# Patient Record
Sex: Female | Born: 1960 | Race: White | Hispanic: No | Smoking: Current every day smoker
Health system: Southern US, Community
[De-identification: ages and names within clinical notes are randomized; demographics above are authoritative.]

## PROBLEM LIST (undated history)

## (undated) DIAGNOSIS — N951 Menopausal and female climacteric states: Secondary | ICD-10-CM

## (undated) DIAGNOSIS — F32A Depression, unspecified: Secondary | ICD-10-CM

## (undated) DIAGNOSIS — G35 Multiple sclerosis: Secondary | ICD-10-CM

## (undated) DIAGNOSIS — F329 Major depressive disorder, single episode, unspecified: Secondary | ICD-10-CM

## (undated) DIAGNOSIS — F419 Anxiety disorder, unspecified: Secondary | ICD-10-CM

## (undated) HISTORY — DX: Major depressive disorder, single episode, unspecified: F32.9

## (undated) HISTORY — PX: BIOPSY BREAST: PRO8

## (undated) HISTORY — DX: Depression, unspecified: F32.A

## (undated) HISTORY — DX: Multiple sclerosis: G35

## (undated) HISTORY — DX: Anxiety disorder, unspecified: F41.9

## (undated) HISTORY — DX: Menopausal and female climacteric states: N95.1

---

## 1993-08-18 DIAGNOSIS — G35 Multiple sclerosis: Secondary | ICD-10-CM

## 1993-08-18 HISTORY — DX: Multiple sclerosis: G35

## 2004-05-06 ENCOUNTER — Emergency Department (HOSPITAL_COMMUNITY): Admission: EM | Admit: 2004-05-06 | Discharge: 2004-05-06 | Payer: Self-pay | Admitting: *Deleted

## 2008-01-07 ENCOUNTER — Ambulatory Visit: Payer: Self-pay | Admitting: Family Medicine

## 2008-01-07 DIAGNOSIS — G35 Multiple sclerosis: Secondary | ICD-10-CM | POA: Insufficient documentation

## 2008-01-14 ENCOUNTER — Encounter: Payer: Self-pay | Admitting: Family Medicine

## 2008-02-10 ENCOUNTER — Encounter: Payer: Self-pay | Admitting: Family Medicine

## 2008-03-02 ENCOUNTER — Ambulatory Visit: Payer: Self-pay | Admitting: Family Medicine

## 2008-03-02 ENCOUNTER — Encounter: Admission: RE | Admit: 2008-03-02 | Discharge: 2008-03-02 | Payer: Self-pay | Admitting: Family Medicine

## 2008-03-02 DIAGNOSIS — J441 Chronic obstructive pulmonary disease with (acute) exacerbation: Secondary | ICD-10-CM

## 2008-03-06 ENCOUNTER — Telehealth: Payer: Self-pay | Admitting: Family Medicine

## 2008-03-08 DIAGNOSIS — N319 Neuromuscular dysfunction of bladder, unspecified: Secondary | ICD-10-CM

## 2008-03-16 ENCOUNTER — Encounter: Payer: Self-pay | Admitting: Family Medicine

## 2008-03-16 ENCOUNTER — Telehealth (INDEPENDENT_AMBULATORY_CARE_PROVIDER_SITE_OTHER): Payer: Self-pay | Admitting: *Deleted

## 2008-03-16 ENCOUNTER — Ambulatory Visit: Payer: Self-pay | Admitting: Family Medicine

## 2008-03-16 ENCOUNTER — Other Ambulatory Visit: Admission: RE | Admit: 2008-03-16 | Discharge: 2008-03-16 | Payer: Self-pay | Admitting: Family Medicine

## 2008-03-16 DIAGNOSIS — N912 Amenorrhea, unspecified: Secondary | ICD-10-CM

## 2008-03-16 LAB — CONVERTED CEMR LAB: Beta hcg, urine, semiquantitative: NEGATIVE

## 2008-03-17 ENCOUNTER — Encounter: Admission: RE | Admit: 2008-03-17 | Discharge: 2008-03-17 | Payer: Self-pay | Admitting: Family Medicine

## 2008-03-22 ENCOUNTER — Encounter: Payer: Self-pay | Admitting: Family Medicine

## 2008-04-04 ENCOUNTER — Encounter: Payer: Self-pay | Admitting: Family Medicine

## 2008-05-03 ENCOUNTER — Telehealth: Payer: Self-pay | Admitting: Family Medicine

## 2008-05-04 ENCOUNTER — Encounter: Payer: Self-pay | Admitting: Family Medicine

## 2008-05-15 ENCOUNTER — Telehealth: Payer: Self-pay | Admitting: Family Medicine

## 2008-05-16 ENCOUNTER — Encounter: Admission: RE | Admit: 2008-05-16 | Discharge: 2008-05-16 | Payer: Self-pay | Admitting: Family Medicine

## 2008-05-16 ENCOUNTER — Ambulatory Visit: Payer: Self-pay | Admitting: Family Medicine

## 2008-05-17 ENCOUNTER — Telehealth: Payer: Self-pay | Admitting: Family Medicine

## 2008-06-15 ENCOUNTER — Ambulatory Visit: Payer: Self-pay | Admitting: Family Medicine

## 2008-07-04 ENCOUNTER — Telehealth: Payer: Self-pay | Admitting: Family Medicine

## 2008-07-07 ENCOUNTER — Telehealth: Payer: Self-pay | Admitting: Family Medicine

## 2008-07-17 ENCOUNTER — Ambulatory Visit: Payer: Self-pay | Admitting: Family Medicine

## 2008-07-17 ENCOUNTER — Telehealth: Payer: Self-pay | Admitting: Family Medicine

## 2008-07-17 LAB — CONVERTED CEMR LAB: Rapid Strep: NEGATIVE

## 2008-07-25 ENCOUNTER — Telehealth: Payer: Self-pay | Admitting: Family Medicine

## 2008-07-27 ENCOUNTER — Encounter: Payer: Self-pay | Admitting: Family Medicine

## 2008-08-03 ENCOUNTER — Telehealth: Payer: Self-pay | Admitting: Family Medicine

## 2008-08-25 ENCOUNTER — Encounter: Payer: Self-pay | Admitting: Family Medicine

## 2008-08-30 ENCOUNTER — Ambulatory Visit: Payer: Self-pay | Admitting: Family Medicine

## 2008-11-24 ENCOUNTER — Encounter: Payer: Self-pay | Admitting: Family Medicine

## 2008-12-13 ENCOUNTER — Ambulatory Visit: Payer: Self-pay | Admitting: Family Medicine

## 2008-12-14 ENCOUNTER — Encounter: Payer: Self-pay | Admitting: Family Medicine

## 2008-12-21 ENCOUNTER — Telehealth (INDEPENDENT_AMBULATORY_CARE_PROVIDER_SITE_OTHER): Payer: Self-pay | Admitting: *Deleted

## 2008-12-22 ENCOUNTER — Ambulatory Visit: Payer: Self-pay | Admitting: Family Medicine

## 2008-12-22 DIAGNOSIS — R0789 Other chest pain: Secondary | ICD-10-CM | POA: Insufficient documentation

## 2008-12-22 DIAGNOSIS — F339 Major depressive disorder, recurrent, unspecified: Secondary | ICD-10-CM | POA: Insufficient documentation

## 2009-01-02 ENCOUNTER — Ambulatory Visit: Payer: Self-pay | Admitting: Family Medicine

## 2009-01-02 DIAGNOSIS — N959 Unspecified menopausal and perimenopausal disorder: Secondary | ICD-10-CM | POA: Insufficient documentation

## 2009-01-09 ENCOUNTER — Telehealth: Payer: Self-pay | Admitting: Family Medicine

## 2009-02-15 ENCOUNTER — Telehealth: Payer: Self-pay | Admitting: Family Medicine

## 2009-02-20 ENCOUNTER — Ambulatory Visit: Payer: Self-pay | Admitting: Family Medicine

## 2009-02-20 DIAGNOSIS — R1011 Right upper quadrant pain: Secondary | ICD-10-CM | POA: Insufficient documentation

## 2009-03-08 ENCOUNTER — Telehealth: Payer: Self-pay | Admitting: Family Medicine

## 2009-03-26 ENCOUNTER — Telehealth: Payer: Self-pay | Admitting: Family Medicine

## 2009-05-24 ENCOUNTER — Telehealth: Payer: Self-pay | Admitting: Family Medicine

## 2009-06-21 ENCOUNTER — Ambulatory Visit: Payer: Self-pay | Admitting: Family Medicine

## 2009-06-21 DIAGNOSIS — K219 Gastro-esophageal reflux disease without esophagitis: Secondary | ICD-10-CM

## 2009-06-21 DIAGNOSIS — J069 Acute upper respiratory infection, unspecified: Secondary | ICD-10-CM | POA: Insufficient documentation

## 2009-07-05 ENCOUNTER — Encounter: Payer: Self-pay | Admitting: Family Medicine

## 2009-07-24 ENCOUNTER — Telehealth: Payer: Self-pay | Admitting: Family Medicine

## 2010-03-05 ENCOUNTER — Ambulatory Visit: Payer: Self-pay | Admitting: Family

## 2010-03-05 DIAGNOSIS — F411 Generalized anxiety disorder: Secondary | ICD-10-CM

## 2010-03-05 DIAGNOSIS — R209 Unspecified disturbances of skin sensation: Secondary | ICD-10-CM | POA: Insufficient documentation

## 2010-03-05 DIAGNOSIS — E559 Vitamin D deficiency, unspecified: Secondary | ICD-10-CM | POA: Insufficient documentation

## 2010-03-05 DIAGNOSIS — E538 Deficiency of other specified B group vitamins: Secondary | ICD-10-CM

## 2010-03-06 ENCOUNTER — Encounter: Payer: Self-pay | Admitting: Family

## 2010-03-06 LAB — CONVERTED CEMR LAB
BUN: 13 mg/dL (ref 6–23)
CO2: 24 meq/L (ref 19–32)
Chloride: 107 meq/L (ref 96–112)
Creatinine, Ser: 0.84 mg/dL (ref 0.40–1.20)

## 2010-03-08 ENCOUNTER — Telehealth: Payer: Self-pay | Admitting: Family Medicine

## 2010-04-05 ENCOUNTER — Ambulatory Visit: Payer: Self-pay | Admitting: Family Medicine

## 2010-04-05 DIAGNOSIS — J45901 Unspecified asthma with (acute) exacerbation: Secondary | ICD-10-CM | POA: Insufficient documentation

## 2010-04-29 ENCOUNTER — Encounter: Payer: Self-pay | Admitting: Family Medicine

## 2010-08-20 ENCOUNTER — Ambulatory Visit
Admission: RE | Admit: 2010-08-20 | Discharge: 2010-08-20 | Payer: Self-pay | Source: Home / Self Care | Attending: Family Medicine | Admitting: Family Medicine

## 2010-08-20 DIAGNOSIS — J019 Acute sinusitis, unspecified: Secondary | ICD-10-CM | POA: Insufficient documentation

## 2010-08-27 ENCOUNTER — Encounter: Payer: Self-pay | Admitting: Family Medicine

## 2010-09-06 ENCOUNTER — Telehealth: Payer: Self-pay | Admitting: Family Medicine

## 2010-09-09 ENCOUNTER — Encounter: Payer: Self-pay | Admitting: Family Medicine

## 2010-09-15 LAB — CONVERTED CEMR LAB: Rapid Strep: NEGATIVE

## 2010-09-18 NOTE — Assessment & Plan Note (Signed)
Summary: f/u on meds- jr   Vital Signs:  Patient profile:   50 year old female Height:      66.5 inches Weight:      165 pounds BMI:     26.33 Pulse rate:   87 / minute BP sitting:   130 / 71  (left arm) Cuff size:   regular  Vitals Entered By: Erika Hancock (March 05, 2010 1:22 PM) CC: needs xanax refilled   Primary Care Provider:  Nani Gasser MD  CC:  needs xanax refilled.  History of Present Illness: Erika Hancock is a 50 year old female who presents today for follow up.   Notes that she has history of Erika and has "Burning sensation" in both feet.  This has been present for several years.  Also notes that she has numbness in the toes of the left foot which started more recently.   She is followed by Dr. Loleta Chance of [facility] for fher Erika.  She stopped Tysabri due to water weight gain.  Reports that she has tried gabapentin in the past but she was intolerant to this medication.  Anxiety- notes that she becomes anxious surrounding the care of her 37 year old autistic child.   Notes that she cannot afford the co-pay on the Cymbalta.  She is taking prozac.  Feels that depression is ok.  Notes that she does have a short fuse.  Ran out of Xanax 3 weeks ago.  Was using xanax about twice a day, but notes that she requires two xanax at a time.    Current Medications (verified): 1)  Flonase 50 Mcg/act  Susp (Fluticasone Propionate) .... Use Two Sprays Each Nostril Daily 2)  Alprazolam 0.5 Mg  Tabs (Alprazolam) .... Take One Tyablet By Mouth Three Times A Day As Needed 3)  Prozac 40 Mg  Caps (Fluoxetine Hcl) .... Take One Tablet By Mouth Once A Day 4)  Hydrocodone-Acetaminophen 5-500 Mg  Tabs (Hydrocodone-Acetaminophen) .... Take One Tablet By Mouth Once A Day 5)  Albuterol Hfa .... 2 Puffs Q 4 Hrs Prn 6)  Albuterol Sulfate 1.25 Mg/48ml  Nebu (Albuterol Sulfate) .[name].. 1 Nebinhaled Q2-4 Hours As Needed Sob or Wheeze 7)  Symbicort 160-4.5 Mcg/act  Aero (Budesonide-Formoterol Fumarate) .... 2 Inh  Bid  Allergies (verified): No Known Drug Allergies  Comments:  Nurse/Medical Assistant: The patient's medications and allergies were reviewed with the patient and were updated in the Medication and Allergy Lists.  Physical Exam  General:  Well-developed,well-nourished,in no acute distress; alert,appropriate and cooperative throughout examination Lungs:  Normal respiratory effort, chest expands symmetrically. Lungs are clear to auscultation, no crackles or wheezes. Heart:  Normal rate and regular rhythm. S1 and S2 normal without gallop, murmur, click, rub or other extra sounds. Neurologic:  decreased sensation with monofilament noted on left foot.   Psych:  mildly anxious female, pleasant and A and O x 3  NAD   Impression & Recommendations:  Problem # 1:  NUMBNESS (ICD-782.0) Assessment Deteriorated May be due to history of Erika, will however check B12, folate, RPR and check serum glucose. Orders: T-Vitamin B12 [phone]) T-Folate [phone]) T-RPR (Syphilis) ([id number]) TLB-BMP (Basic Metabolic Panel-BMET) (80048-METABOL)  Problem # 2:  DEPRESSION, ACUTE (ICD-296.23) Assessment: Improved Overall stable,stopped cymbalta due to cost.  Will plan to continue Prozac  Problem # 3:  ANXIETY (ICD-300.00) Assessment: Deteriorated Will increase alprazolam as pt is already taking at higher dose.  Instructed patient to use as needed. The following medications were removed from the  medication list:    Cymbalta 60 Mg Cpep (Duloxetine hcl) .[name]... Take 1 tablet by mouth once a day Her updated medication list for this problem includes:    Alprazolam 1 Mg Tabs (Alprazolam) ..... One tablet by mouth three times a day prn    Prozac 40 Mg Caps (Fluoxetine hcl) .[name]... Take one tablet by mouth once a day  Complete Medication List: 1)  Flonase 50 Mcg/act Susp (Fluticasone propionate) .... Use two sprays each nostril daily 2)  Alprazolam 1 Mg Tabs (Alprazolam) .... One tablet by mouth three times a  day prn 3)  Prozac 40 Mg Caps (Fluoxetine hcl) .... Take one tablet by mouth once a day 4)  Hydrocodone-acetaminophen 5-500 Mg Tabs (Hydrocodone-acetaminophen) .... Take one tablet by mouth once a day 5)  Albuterol Hfa  .... 2 puffs q 4 hrs prn 6)  Albuterol Sulfate 1.25 Mg/52ml Nebu (Albuterol sulfate) .[name].. 1 nebinhaled q2-4 hours as needed sob or wheeze 7)  Symbicort 160-4.5 Mcg/act Aero (Budesonide-formoterol fumarate) .... 2 inh bid 8)  Cobal-1000 1000 Mcg/ml Soln (Cyanocobalamin) .... Once monthly injection 9)  Vitamin D (ergocalciferol) 50000 Unit Caps (Ergocalciferol) .... One cap by mouth weekly  Patient Instructions: 1)  Complete your blood work today. 2)  Follow up with Dr. Linford Arnold in 1-2 months. Prescriptions: ALPRAZOLAM 1 MG TABS (ALPRAZOLAM) one tablet by mouth three times a day PRN  #60 x 0   Entered and Authorized by:   Lemont Fillers FNP   Signed by:   Lemont Fillers FNP on 03/05/2010   Method used:   Print then Give to Patient   RxID:   [id number]

## 2010-09-18 NOTE — Letter (Signed)
   [place] [facility] [address] [place], [place]  [address]  [place] Phone: [phone]      March 06, 2010   [facility] [place] [address] [place], [place] [address]  RE:  LAB RESULTS  Dear  Ms. Partridge,  The following is an interpretation of your most recent lab tests.  Please take note of any instructions provided or changes to medications that have resulted from your lab work.  ELECTROLYTES:  Good - no changes needed  KIDNEY FUNCTION TESTS:  Good - no changes needed Your B12, folate and RPR levels are normal.  I suspect that the pain/burning that you are having is unfortunately due to your MS.  I would recommend that you discuss this further with your neurologist. Please contact us if you have any further questions.   Sincerely Yours,    Lemont Fillers FNP  Appended Document:  Letter mailed.

## 2010-09-18 NOTE — Letter (Signed)
Summary: Anxiety Questionnaire  Anxiety Questionnaire   Imported By: Lanelle Bal 04/12/2010 11:55:14  _____________________________________________________________________  External Attachment:    Type:   Image     Comment:   External Document

## 2010-09-18 NOTE — Assessment & Plan Note (Signed)
Summary: 1 mo fasting f/u anxiety, asthma exercerbation.    Vital Signs:  Patient profile:   50 year old female Height:      66.5 inches Weight:      161 pounds Pulse rate:   80 / minute BP sitting:   122 / 80  (left arm) Cuff size:   regular  Vitals Entered By: Avon Gully CMA, Duncan Dull) (April 05, 2010 8:40 AM)  Serial Vital Signs/Assessments:                                PEF    PreRx  PostRx Time      O2 Sat  O2 Type     L/min  L/min  L/min   By 9:07 AM                       200    160    170     Andrea McCrimmon CMA, (AAMA)  CC: f/u mood   Primary Care Julieann Drummonds:  Nani Gasser MD  CC:  f/u mood.  History of Present Illness: Cough and wheezing for one week.  Has used a whole ventolin inhaler. Worse when she lays flat. Chills all day yesterday.  + HA. NO ST or nasal congestion. Very tired. Hopes we have some sybicort samples. Has been out and copay is expensive.   Her anxiety is only fiarly well controlled. Does so much better on cymbalta but can't afford it. Says the higher does on the xanax has really helped her.   Current Medications (verified): 1)  Flonase 50 Mcg/act  Susp (Fluticasone Propionate) .... Use Two Sprays Each Nostril Daily 2)  Alprazolam 1 Mg Tabs (Alprazolam) .... One Tablet By Mouth Three Times A Day Prn 3)  Prozac 40 Mg  Caps (Fluoxetine Hcl) .... Take One Tablet By Mouth Once A Day 4)  Hydrocodone-Acetaminophen 5-500 Mg  Tabs (Hydrocodone-Acetaminophen) .... Take One Tablet By Mouth Once A Day 5)  Albuterol Hfa .... 2 Puffs Q 4 Hrs Prn 6)  Albuterol Sulfate 1.25 Mg/27ml  Nebu (Albuterol Sulfate) .[name].. 1 Nebinhaled Q2-4 Hours As Needed Sob or Wheeze 7)  Symbicort 160-4.5 Mcg/act  Aero (Budesonide-Formoterol Fumarate) .... 2 Inh Bid 8)  Cobal-1000 1000 Mcg/ml Soln (Cyanocobalamin) .... Once Monthly Injection 9)  Vitamin D (Ergocalciferol) 50000 Unit Caps (Ergocalciferol) .... One Cap By Mouth Weekly  Allergies (verified): No Known Drug  Allergies  Comments:  Nurse/Medical Assistant:  The patient's medications and allergies were reviewed with the patient and were updated in the Medication and Allergy Lists. Avon Gully CMA, Duncan Dull) (April 05, 2010 8:41 AM)  Social History: Retired Designer, industrial/product. Married recently to [employer]  Has grown son in [place] and a daughter with Asberger's syndrome. Ex husband is Programmer, applications.  Quit smoking 2010 infrequent alcohol use Walks 3 x a wk. Fair diet.  Physical Exam  General:  Well-developed,well-nourished,in no acute distress; alert,appropriate and cooperative throughout examination Head:  Normocephalic and atraumatic without obvious abnormalities. No apparent alopecia or balding. Eyes:  No corneal or conjunctival inflammation noted. EOMI. Perrla.  Ears:  External ear exam shows no significant lesions or deformities.  Otoscopic examination reveals clear canals, tympanic membranes are intact bilaterally without bulging, retraction, inflammation or discharge. Hearing is grossly normal bilaterally. Nose:  External nasal examination shows no deformity or inflammation. Nasal mucosa are pink and moist without lesions or exudates. Mouth:  Oral mucosa  and oropharynx without lesions or exudates.  Teeth in good repair. Neck:  No deformities, masses, or tenderness noted. Lungs:  Expiratory wheezing bilat with crackles at the left base. normal respiratory effort.  Is more SOB with talking.  Heart:  Normal rate and regular rhythm. S1 and S2 normal without gallop, murmur, click, rub or other extra sounds. Skin:  no rashes.   Psych:  Cognition and judgment appear intact. Alert and cooperative with normal attention span and concentration. No apparent delusions, illusions, hallucinations   Impression & Recommendations:  Problem # 1:  ANXIETY (ICD-300.00) She is not well controlled at all. Her GAD-7 score is 16.  She is not on the higher strength alprazolam and it is working much better. She does  much better on cymbalta but cannot afford the copay at this time.  Her updated medication list for this problem includes:    Alprazolam 1 Mg Tabs (Alprazolam) ..... One tablet by mouth three times a day prn    Prozac 40 Mg Caps (Fluoxetine hcl) .[name]... Take one tablet by mouth once a day  Problem # 2:  ASTHMA UNSPECIFIED WITH EXACERBATION (ICD-493.92) I think she likley has PNA> She didn't want a neb tx in the office since has already had 3 this AM and she Also didn't want oral steroids but did agree to let me give her steroid injection in the office.  Will treat for likely PNA. REcommend NEBs every 4 hours.  Given copay card and sample os symbicort. I prefer she f/u next week but she said if she was better she won't come back in. Tried to remind her that you can die from asthma and to really seek care if gets worse.  Her updated medication list for this problem includes:    Albuterol Sulfate 1.25 Mg/35ml Nebu (Albuterol sulfate) .[name]... 1 nebinhaled q2-4 hours as needed sob or wheeze    Symbicort 160-4.5 Mcg/act Aero (Budesonide-formoterol fumarate) .[name]... 2 inh bid  Orders: Depo- Medrol 80mg  (J1040) Peak Flow Rate (94150)  Complete Medication List: 1)  Flonase 50 Mcg/act Susp (Fluticasone propionate) .... Use two sprays each nostril daily 2)  Alprazolam 1 Mg Tabs (Alprazolam) .... One tablet by mouth three times a day prn 3)  Prozac 40 Mg Caps (Fluoxetine hcl) .... Take one tablet by mouth once a day 4)  Hydrocodone-acetaminophen 5-500 Mg Tabs (Hydrocodone-acetaminophen) .... Take one tablet by mouth once a day 5)  Albuterol Hfa  .... 2 puffs q 4 hrs prn 6)  Albuterol Sulfate 1.25 Mg/54ml Nebu (Albuterol sulfate) .[name].. 1 nebinhaled q2-4 hours as needed sob or wheeze 7)  Symbicort 160-4.5 Mcg/act Aero (Budesonide-formoterol fumarate) .... 2 inh bid 8)  Cobal-1000 1000 Mcg/ml Soln (Cyanocobalamin) .... Once monthly injection 9)  Vitamin D (ergocalciferol) 50000 Unit Caps (Ergocalciferol) .... One cap  by mouth weekly 10)  Zithromax Z-pak 250 Mg Tabs (Azithromycin) .... Take as directed.  Patient Instructions: 1)  Take antiobiotic. If not getting better into next week call the office. 2)  Go to ED if your asthma gets worse. 3)  DO you Nebs every 4 hours for the next 3-4 days and then can space out to every 6 hours as feeling better.  Prescriptions: ALPRAZOLAM 1 MG TABS (ALPRAZOLAM) one tablet by mouth three times a day PRN  #60 x 0   Entered and Authorized by:   Nani Gasser MD   Signed by:   Nani Gasser MD on 04/05/2010   Method used:   Printed then faxed to .Marland KitchenMarland Kitchen  [facility]  [address] (retail)       [address]       [place], [place]  [address]       Ph: [phone]       Fax: [phone]   RxID:   [id number] ALBUTEROL SULFATE 1.25 MG/3ML  NEBU (ALBUTEROL SULFATE) 1 NEBinhaled Q2-4 hours as needed SOB or wheeze  #1 box x 3   Entered and Authorized by:   Nani Gasser MD   Signed by:   Nani Gasser MD on 04/05/2010   Method used:   Electronically to        [facility]  [address] (retail)       [address]       [place], [place]  [address]       Ph: [phone]       Fax: [phone]   RxID:   [id number] ZITHROMAX Z-PAK 250 MG TABS (AZITHROMYCIN) Take as directed.  #1 x 0   Entered and Authorized by:   Nani Gasser MD   Signed by:   Nani Gasser MD on 04/05/2010   Method used:   Electronically to        [facility] (retail)       [address]       [place], [place]  [address]       Ph: [phone]       Fax: [phone]   RxID:   [id number]    Medication Administration  Injection # 1:    Medication: Depo- Medrol 80mg     Diagnosis: ASTHMA UNSPECIFIED WITH EXACERBATION [phone])    Route: IM    Site: RUOQ gluteus    Exp Date: 09/18/2010    Lot #: [id number] [id number]    Mfr: Pharmacia    Patient tolerated injection without complications    Given by: Avon Gully CMA, Duncan Dull) (April 05, 2010 9:09  AM)  Orders Added: 1)  Depo- Medrol 80mg  [J1040] 2)  Est. Patient Level IV [37106] 3)  Peak Flow Rate [94[address]]

## 2010-09-18 NOTE — Letter (Signed)
Summary: [facility]  [facility]   Imported By: Lanelle Bal 05/31/2010 12:31:22  _____________________________________________________________________  External Attachment:    Type:   Image     Comment:   External Document

## 2010-09-18 NOTE — Progress Notes (Signed)
Summary: lab results  Phone Note Call from Patient Call back at [phone]   Caller: Patient Call For: Nani Gasser MD Summary of Call: pt called wanting to know lab results- gave her instructions on labs per the letter that Sandford Craze had sent her Initial call taken by: Kathlene November,  March 08, 2010 12:43 PM

## 2010-09-19 NOTE — Progress Notes (Addendum)
Summary: Med Issue  Phone Note Call from Patient Call back at Home Phone [phone]   Caller: Patient Reason for Call: Refill Medication Summary of Call: Refill was sent on 09/03/10 for generic prozac.  Pt was previously taking 40 mg/day and new rx was for prozac 20 mg--1/2 tab for one week then one tablet daily.  Pt is confused by new rx.  Please call pt to clarify. Initial call taken by: Francee Piccolo CMA Duncan Dull),  September 06, 2010 3:47 PM  Follow-up for Phone Call        Per our records the 40mg  was last filled in July so I assumed she wanted to re-start the med so to do that I had to start at the lower dose. Then when do for refill can call her and have [place] bump it ot the 40mg .  If has been off the meds for awhile can't restart at the 40mg  dose, unless she was getting it somewhere else for awhile.  Follow-up by: Nani Gasser MD,  September 09, 2010 9:06 AM  Additional Follow-up for Phone Call Additional follow up Details #1::        pt notified Additional Follow-up by: Avon Gully CMA, Duncan Dull),  September 10, 2010 11:09 AM
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
## 2010-10-01 ENCOUNTER — Telehealth: Payer: Self-pay | Admitting: Family Medicine

## 2010-10-03 NOTE — Letter (Signed)
Summary: [facility]  [facility]   Imported By: Maryln Gottron 09/25/2010 10:32:39  _____________________________________________________________________  External Attachment:    Type:   Image     Comment:   External Document

## 2010-10-09 NOTE — Progress Notes (Signed)
Summary: [facility]-med issue  Phone Note Call from Patient Call back at Home Phone [phone]   Caller: Patient Call For: Nani Gasser MD Summary of Call: pt is trying to transfer rx for generic prozac.  Unsure in message left which pharm she is transferring to/from.  LM to RC at home number  Initial call taken by: Francee Piccolo CMA Duncan Dull),  October 01, 2010 3:01 PM  Follow-up for Phone Call        Received call from [facility] at [facility].  Pt needs rx for 40mg  prozac called to them.  According to [facility], pt is doubling up 20 mg from an old rx.  Still have not received a return call from pt to clarify any of above. Follow-up by: Francee Piccolo CMA Duncan Dull),  October 01, 2010 3:52 PM  Additional Follow-up for Phone Call Additional follow up Details #1::        Pt really needs to f/u in the ofice.   Additional Follow-up by: Nani Gasser MD,  October 01, 2010 4:41 PM    Additional Follow-up for Phone Call Additional follow up Details #2::    RC #2 to pt.  LM to RC at home number. Francee Piccolo CMA Duncan Dull)  October 02, 2010 1:43 PM  RC from pt.  Pt states she has never been off of prozac in the last 8 1/2 years and does not understand why we call in the 20 mg tablets last time.  I tried to explain to pt that given the date of her last refill and the number of tablets given she should have run out of meds in November, not January.  Pt states she has never missed a dose.  Given this information, pt would like to know if she still needs an office visit or can she just have refills.  Pt is aware Dr. Linford Arnold out of the office this afternoon and will not be in until tomorrow.  Pt states she has enough meds to last a few more days.  Last anxiety follow up was in 03/2010.  Please advise. Follow-up by: Francee Piccolo CMA Duncan Dull),  October 02, 2010 2:00 PM  Additional Follow-up for Phone Call Additional follow up Details #3:: Details for Additional Follow-up Action Taken: OK  will change to 40mg . I assumed she was out since the rx I last gave her would have run out in Nov thus I was calling in a "restart dose".  New rx sent. See below.  Additional Follow-up by: Nani Gasser MD,  October 03, 2010 9:54 AM  New/Updated Medications: FLUOXETINE HCL 40 MG CAPS (FLUOXETINE HCL) Take 1 tablet by mouth once a day Prescriptions: FLUOXETINE HCL 40 MG CAPS (FLUOXETINE HCL) Take 1 tablet by mouth once a day  #30 x 4   Entered by:   Francee Piccolo CMA (AAMA)   Authorized by:   Nani Gasser MD   Signed by:   Francee Piccolo CMA (AAMA) on 10/03/2010   Method used:   Electronically to        [facility]* (retail)       [address]       [place], [place]  [address]       Ph: [phone]       Fax: [phone]   RxID:   [id number] FLUOXETINE HCL 40 MG CAPS (FLUOXETINE HCL) Take 1 tablet by mouth once a day  #30 x 4   Entered and Authorized by:   Nani Gasser MD   Signed by:  Nani Gasser MD on 10/03/2010   Method used:   Electronically to        [facility]* (retail)       [address]       [place], [place]  [address]       Ph: [phone]       Fax: [phone]   RxID:   [id number]  RX sent to [facility] per patient request.  Rx cancelled at [facility].  Notified pt rx sent to pharm.  She is appreciative. Francee Piccolo CMA Duncan Dull)  October 03, 2010 3:50 PM

## 2010-11-14 ENCOUNTER — Other Ambulatory Visit: Payer: Self-pay | Admitting: *Deleted

## 2010-11-14 MED ORDER — ALBUTEROL SULFATE HFA 108 (90 BASE) MCG/ACT IN AERS: 2.0000 | INHALATION_SPRAY | Freq: Four times a day (QID) | RESPIRATORY_TRACT | Status: DC | PRN

## 2011-05-06 ENCOUNTER — Other Ambulatory Visit: Payer: Self-pay | Admitting: Family Medicine

## 2011-05-06 ENCOUNTER — Telehealth: Payer: Self-pay | Admitting: Family Medicine

## 2011-05-06 MED ORDER — BUDESONIDE-FORMOTEROL FUMARATE 160-4.5 MCG/ACT IN AERO: 2.0000 | INHALATION_SPRAY | Freq: Two times a day (BID) | RESPIRATORY_TRACT | Status: DC

## 2011-05-06 NOTE — Telephone Encounter (Signed)
Refill requested for symbicort. Plan:  Already sent today electronically. Jarvis Newcomer, LPN [facility]

## 2011-05-06 NOTE — Telephone Encounter (Signed)
Patient called left voicemail that her [place] pharmacy has faxed over request for refill twice and they have not heard anything from our office. Pt states if there are any issues you can call her [phone]

## 2011-05-19 ENCOUNTER — Encounter: Payer: Self-pay | Admitting: Family Medicine

## 2011-05-20 ENCOUNTER — Ambulatory Visit (INDEPENDENT_AMBULATORY_CARE_PROVIDER_SITE_OTHER): Payer: Medicare PPO | Admitting: Family Medicine

## 2011-05-20 ENCOUNTER — Encounter: Payer: Self-pay | Admitting: Family Medicine

## 2011-05-20 VITALS — BP 128/84 | HR 107 | Wt 179.0 lb

## 2011-05-20 DIAGNOSIS — R1011 Right upper quadrant pain: Secondary | ICD-10-CM

## 2011-05-20 NOTE — Progress Notes (Signed)
Subjective:    Patient ID: Erika Hancock, female    DOB: 1961-02-13, 50 y.o.   MRN: [id number]  HPI Pain on RT upper side, intermittant for several months.  Getting worse over the last week. Went to ED at [facility] and they didn't do a GB US.  Says phenergan didn't really help.  Did blood work which was all normal. Normal CXR.  Has been SOB. Pain feels like a burning pain. Also some aching into her neck. Worse after she ets.  They put her on lasix to help with swelling.  Vomiting occ.  More loose stools lately.    Review of Systems  BP 128/84  Pulse 107  Wt 179 lb (81.194 kg)    No Known Allergies  Past Medical History  Diagnosis Date  . MS (multiple sclerosis) 1995    relapsing  . Anxiety   . Depression   . Asthma   . Perimenopausal     Past Surgical History  Procedure Date  . Biopsy breast 1999, 2000    benign    History   Social History  . Marital Status: Legally Separated    Spouse Name: N/A    Number of Children: N/A  . Years of Education: N/A   Occupational History  . Not on file.   Social History Main Topics  . Smoking status: Former Smoker    Quit date: 08/18/2008  . Smokeless tobacco: Not on file  . Alcohol Use: Yes     infrequent  . Drug Use:   . Sexually Active:      lab tech, married, 2 children, walks 3 X week, fair diet.   Other Topics Concern  . Not on file   Social History Narrative  . No narrative on file    Family History  Problem Relation Age of Onset  . Heart disease Mother     died CHF  . Heart disease Father 4    heart attack  . Hypertension Father   . COPD Sister   . Glaucoma Sister   . Hypertension Sister   . Multiple sclerosis      cousin    Erika Hancock does not currently have medications on file.     Objective:   Physical Exam  Constitutional: She is oriented to person, place, and time. She appears well-developed and well-nourished.  HENT:  Head: Normocephalic and atraumatic.  Cardiovascular: Normal rate,  regular rhythm and normal heart sounds.   Pulmonary/Chest: Effort normal and breath sounds normal.  Abdominal: Soft. Bowel sounds are normal. She exhibits no distension and no mass. There is tenderness. There is no rebound and no guarding.       Tender in both Upper quad but more so in the RUQ.    Neurological: She is alert and oriented to person, place, and time.  Skin: Skin is warm and dry.  Psychiatric: She has a normal mood and affect. Her behavior is normal.          Assessment & Plan:  RUQ Pain - likely she has cholecystitis. Evidently she had normal blood work yesterday but I would like her to get a blood count again today to make sure her white count started increasing so she does so she had a low-grade temp yesterday. We will schedule her for an ultrasound here later this afternoon or first thing in the morning. She has to be fasting for approximately 6 hours and she did eat some soup a couple of hours ago. I  encouraged her to eat a very bland diet. Avoid anything with it enough and content which was similar to the gallbladder. She already takes a PPI and I encouraged her to continue this and she can increase to twice a day if needed. I offered her prescription for Phenergan but she already has it at home and says it doesn't really help. She does have a positive ultrasound scan of her gallbladder then we will need to refer her probably for surgical removal.

## 2011-05-21 ENCOUNTER — Other Ambulatory Visit: Payer: Self-pay | Admitting: Family Medicine

## 2011-05-21 ENCOUNTER — Ambulatory Visit
Admission: RE | Admit: 2011-05-21 | Discharge: 2011-05-21 | Disposition: A | Discharge status: Home / Self Care | Payer: Medicare PPO | Source: Ambulatory Visit | Attending: Family Medicine | Admitting: Family Medicine

## 2011-05-21 ENCOUNTER — Telehealth: Payer: Self-pay | Admitting: *Deleted

## 2011-05-21 DIAGNOSIS — R1011 Right upper quadrant pain: Secondary | ICD-10-CM

## 2011-05-21 LAB — COMPLETE METABOLIC PANEL WITH GFR
ALT: 12 U/L (ref 0–35)
AST: 18 U/L (ref 0–37)
CO2: 27 mEq/L (ref 19–32)
Calcium: 9.5 mg/dL (ref 8.4–10.5)
Chloride: 99 mEq/L (ref 96–112)
Creat: 0.87 mg/dL (ref 0.50–1.10)
GFR, Est African American: >60 mL/min (ref >60)
Potassium: 4.3 mEq/L (ref 3.5–5.3)
Sodium: 139 mEq/L (ref 135–145)
Total Protein: 7.4 g/dL (ref 6.0–8.3)

## 2011-05-21 LAB — CBC WITH DIFFERENTIAL/PLATELET
Basophils Absolute: 0.1 10*3/uL (ref 0.0–0.1)
Eosinophils Relative: 4 % (ref 0–5)
Lymphocytes Relative: 29 % (ref 12–46)
Lymphs Abs: 2.4 10*3/uL (ref 0.7–4.0)
MCV: 100.2 fL — ABNORMAL HIGH (ref 78.0–100.0)
Neutro Abs: 5.1 10*3/uL (ref 1.7–7.7)
Neutrophils Relative %: 60 % (ref 43–77)
Platelets: 249 10*3/uL (ref 150–400)
RBC: 4.11 MIL/uL (ref 3.87–5.11)
RDW: 13.7 % (ref 11.5–15.5)
WBC: 8.5 10*3/uL (ref 4.0–10.5)

## 2011-05-21 NOTE — Telephone Encounter (Signed)
Pt notified of results via VM. KJ LPN 

## 2011-05-21 NOTE — Telephone Encounter (Signed)
Message copied by Lanae Crumbly on Wed May 21, 2011  9:48 AM ------      Message from: Nani Gasser D      Created: Wed May 21, 2011  7:48 AM       White count is normal which is reassuring for no gallbladder infection. That she may still have stones and inflammation. Her liver enzymes were normal as well.

## 2011-05-22 ENCOUNTER — Telehealth: Payer: Self-pay | Admitting: Family Medicine

## 2011-05-22 DIAGNOSIS — R1011 Right upper quadrant pain: Secondary | ICD-10-CM

## 2011-05-22 NOTE — Telephone Encounter (Signed)
Pt informed. .mysiog

## 2011-05-22 NOTE — Telephone Encounter (Signed)
Message copied by Gifford Shave on Thu May 22, 2011  8:53 AM ------      Message from: Nani Gasser D      Created: Wed May 21, 2011  9:15 PM       Gallbladder appears normal.  Some sludge in the common bile duct. Let me knwo how she is doing.

## 2011-05-22 NOTE — Telephone Encounter (Signed)
Pt calling for results of her ultrasound. Plan:  Pt given results and still complaining of pain #8/10 and tenderness at sight, and nauseted. Please advise. Routed to Dr. Marlyne Beards, LPN [facility]'

## 2011-05-22 NOTE — Telephone Encounter (Signed)
I will refer her to surgery to see if they feel she might benefit from gallbladder removal. I also want her to start prilosec OTC  Bid to help with her symptoms.

## 2011-05-23 ENCOUNTER — Telehealth: Payer: Self-pay | Admitting: *Deleted

## 2011-05-23 NOTE — Telephone Encounter (Signed)
Pt notified and is still having the pain. Advised we will be referring to surgeon

## 2011-05-23 NOTE — Telephone Encounter (Signed)
Message copied by Wyline Beady on Fri May 23, 2011  8:54 AM ------      Message from: Nani Gasser D      Created: Wed May 21, 2011  9:15 PM       Gallbladder appears normal.  Some sludge in the common bile duct. Let me knwo how she is doing.

## 2011-06-03 ENCOUNTER — Ambulatory Visit (INDEPENDENT_AMBULATORY_CARE_PROVIDER_SITE_OTHER): Payer: Medicare PPO | Admitting: Surgery

## 2011-06-03 ENCOUNTER — Encounter (INDEPENDENT_AMBULATORY_CARE_PROVIDER_SITE_OTHER): Payer: Self-pay | Admitting: Surgery

## 2011-06-03 VITALS — BP 123/76 | HR 81 | Ht 68.0 in | Wt 186.0 lb

## 2011-06-03 DIAGNOSIS — K8020 Calculus of gallbladder without cholecystitis without obstruction: Secondary | ICD-10-CM

## 2011-06-03 DIAGNOSIS — K8050 Calculus of bile duct without cholangitis or cholecystitis without obstruction: Secondary | ICD-10-CM | POA: Insufficient documentation

## 2011-06-03 NOTE — Patient Instructions (Signed)
Laparoscopic Removal of Gallbladder (Cholecystectomy), Care After Refer to this sheet in the next few weeks. These discharge instructions provide you with general information on caring for yourself after you leave the hospital. Your caregiver may also give you specific instructions. Your treatment has been planned according to the most current medical practices available, but unavoidable complications sometimes occur. If you have any problems or questions after discharge, please call your caregiver. HOME CARE INSTRUCTIONS  You may put ice on the surgical site.   Put ice in a plastic bag.   Place a towel between your skin and the bag.   Leave the ice on for 20 minutes, 3 times per day.   Change bandages (dressings) as directed.   Keep the wound dry and clean. The wound may be washed gently with a soap that kills germs (antimicrobial). Gently blot or dab dry. Do not rub the wound.   Do not take baths or use swimming pools or hot tubs for 10 days, or as instructed by your caregiver.   Only take over-the-counter or prescription medicines for pain, discomfort, or fever as directed by your caregiver.   You may continue your normal diet as directed.   Do not lift anything more than 10 pounds or play contact sports for 3 weeks, or as directed.   SEEK MEDICAL CARE IF:  There is redness, swelling, or increasing pain in the wound.   You notice pus coming from the wound.   There is drainage from a wound lasting longer than 1 day.   You have an oral temperature above 101.   There is a bad smell coming from the wound or dressing.   The cut (incision) breaks open.  SEEK IMMEDIATE MEDICAL CARE IF:  You develop a rash.   You have difficulty breathing.   You develop any reaction or side effects to medicines given.   You have an oral temperature above 101, not controlled by medicine.   You have increasing pain in the shoulders (shoulder strap areas). Some pain is common and expected  because of the gas inserted into your belly (abdomen) during the procedure.   You develop dizzy episodes or fainting while standing.   You develop severe belly (abdominal) pain.   You develop persistent nausea or vomiting lasting more than 1 day.  MAKE SURE YOU:  Understand these instructions.   Will watch your condition.   Will get help right away if you are not doing well or get worse.  Document Released: 08/04/2005 Document Re-Released: 01/22/2010 [facility] Patient Information 2011 [place], [place].

## 2011-06-03 NOTE — Progress Notes (Signed)
Chief Complaint  Patient presents with  . Abdominal Pain    gallbladder    HPI Erika Hancock is a 50 y.o. female.  HPI the patient presents today at the request of [name] due to RUQ pain. The patient has had these symptoms for one year. The right upper quadrant pain has worsened over the last 2 weeks. She went to [facility] and had a CT scan of her abdomen that she states was normal. The pain is located in her right upper quadrant. She has nausea and bloating associated with it. She denies any fever or chills. The pain is constant and made worse with eating. Intensity is anywhere from a 3- to a 10 out of 10.  Abdominal ultrasound was done which shows no gallstones. Her common bile duct was not dilated. There was some questionable echogenic signal from the common bile duct region.  Past Medical History  Diagnosis Date  . MS (multiple sclerosis) 1995    relapsing  . Anxiety   . Depression   . Asthma   . Perimenopausal     Past Surgical History  Procedure Date  . Biopsy breast 1999, 2000    benign    Family History  Problem Relation Age of Onset  . Heart disease Mother     died CHF  . Heart disease Father 72    heart attack  . Hypertension Father   . COPD Sister   . Glaucoma Sister   . Hypertension Sister   . Multiple sclerosis      cousin    Social History History  Substance Use Topics  . Smoking status: Former Smoker    Quit date: 08/18/2008  . Smokeless tobacco: Not on file  . Alcohol Use: Yes     infrequent    No Known Allergies  Current Outpatient Prescriptions  Medication Sig Dispense Refill  . albuterol (ACCUNEB) 1.25 MG/3ML nebulizer solution Take 1 ampule by nebulization every 6 (six) hours as needed.        [name] albuterol (VENTOLIN HFA) 108 (90 BASE) MCG/ACT inhaler Inhale 2 puffs into the lungs every 6 (six) hours as needed for wheezing.  1 Inhaler  2  . ALPRAZolam (XANAX) 0.5 MG tablet Take 0.5 mg by mouth 3 (three)  times daily as needed.        . budesonide-formoterol (SYMBICORT) 160-4.5 MCG/ACT inhaler Inhale 2 puffs into the lungs 2 (two) times daily.  1 Inhaler  4  . cyanocobalamin (COBAL-1000) 1000 MCG/ML injection Inject 1,000 mcg into the muscle every 30 (thirty) days.        [name] FLUoxetine (PROZAC) 40 MG capsule Take 40 mg by mouth daily.        . fluticasone (FLONASE) 50 MCG/ACT nasal spray Place 2 sprays into the nose daily.        . furosemide (LASIX) 40 MG tablet Take 40 mg by mouth 2 (two) times daily.        [name] HYDROcodone-acetaminophen (VICODIN) 5-500 MG per tablet Take 1 tablet by mouth daily.        [name] omeprazole (PRILOSEC) 40 MG capsule Take 40 mg by mouth daily.        . Potassium Chloride (KLOR-CON 10 PO) Take by mouth.        . Vitamin D, Ergocalciferol, (DRISDOL) 50000 UNITS CAPS Take 50,000 Units by mouth every 7 (seven) days.          Review of Systems Review of Systems  Constitutional: Positive for fatigue and unexpected weight change.  HENT: Negative.   Eyes: Negative.   Respiratory: Positive for cough and shortness of breath.   Cardiovascular: Negative.   Gastrointestinal: Positive for nausea, abdominal pain and abdominal distention.  Genitourinary: Negative.   Musculoskeletal: Positive for arthralgias.  Skin: Negative.   Neurological: Negative.   Hematological: Negative.   Psychiatric/Behavioral: Positive for dysphoric mood. The patient is nervous/anxious.     Blood pressure 123/76, pulse 81, height 5\' 8"  (1.727 m), weight 186 lb (84.369 kg).  Physical Exam Physical Exam  Constitutional: She appears well-developed and well-nourished.  HENT:  Head: Normocephalic and atraumatic.  Nose: Nose normal.  Eyes: EOM are normal. Pupils are equal, round, and reactive to light.  Neck: Normal range of motion.  Cardiovascular: Normal rate and regular rhythm.   Pulmonary/Chest: Effort normal and breath sounds normal.  Abdominal: Soft. Bowel sounds are normal. She exhibits  distension. There is no tenderness.  Musculoskeletal: Normal range of motion.  Neurological: She is alert. She has normal reflexes.  Skin: Skin is warm and dry.  Psychiatric: She has a normal mood and affect. Her behavior is normal. Judgment and thought content normal.    Data Reviewed Abd U/s  No gallstones or CBD dilation possible common bile duct echogenic focus.  Assessment    Abdominal pain question biliary colic without cholelithiasis    Plan    I had a long discussion today about the patient's symptoms and test findings. She does not have any evidence of gallstones. I do not have her [facility] report [facility] but it sounds normal from her  report. She could have biliary dyskinesia. She has not had a HIDA study at this point. She would like to have her gallbladder removed since she is convinced this is causing her discomfort and pain. I explained to her that removing her gallbladder may not make her symptoms go away in this setting.  certainly, this could be done laparoscopically. I discussed further workup with her to better understand the etiology. I told her the likelihood that removing her gallbladder will make her pain go away he is 50%. some of her symptoms do sound like they are biliary in nature. Further workup would be endoscopy.  She does not wish to do this currently.   She would like to proceed with surgical removal of her gallbladder. She understands that if this does not relieve her pain she will require further workup and potentially more surgery. She would like to proceed with laparoscopic cholecystectomy and cholangiogram.The procedure has been discussed with the patient. Operative and non operative treatments have been discussed. Risks of surgery include bleeding, iInfection,  Common bile duct injury,  Injury to the stomach,liver, colon,small intestine, abdominal wall,  Diaphragm,  Major blood vessels,  And the need for an open procedure.  Other risks include worsening of  medical problems, death,  DVT and pulmonary embolism, and cardiovascular events.   Medical options have also been discussed. The patient has been informed of long term expectations of surgery and non surgical options,  The patient agrees to proceed.         Shakai Dolley A. 06/03/2011, 2:40 PM

## 2011-06-19 ENCOUNTER — Other Ambulatory Visit (INDEPENDENT_AMBULATORY_CARE_PROVIDER_SITE_OTHER): Payer: Self-pay | Admitting: Surgery

## 2011-06-19 DIAGNOSIS — K811 Chronic cholecystitis: Secondary | ICD-10-CM

## 2011-08-14 ENCOUNTER — Ambulatory Visit: Payer: Medicare PPO | Admitting: Family Medicine

## 2011-11-20 ENCOUNTER — Telehealth: Payer: Self-pay | Admitting: *Deleted

## 2011-11-20 NOTE — Telephone Encounter (Signed)
Med list updated

## 2011-11-25 ENCOUNTER — Ambulatory Visit: Payer: Medicare PPO | Admitting: Family Medicine

## 2011-11-26 ENCOUNTER — Ambulatory Visit (INDEPENDENT_AMBULATORY_CARE_PROVIDER_SITE_OTHER): Payer: Medicare PPO | Admitting: Physician Assistant

## 2011-11-26 ENCOUNTER — Encounter: Payer: Self-pay | Admitting: Physician Assistant

## 2011-11-26 ENCOUNTER — Ambulatory Visit
Admission: RE | Admit: 2011-11-26 | Discharge: 2011-11-26 | Disposition: A | Discharge status: Home / Self Care | Payer: Medicare PPO | Source: Ambulatory Visit | Attending: Physician Assistant | Admitting: Physician Assistant

## 2011-11-26 VITALS — BP 119/77 | HR 90 | Temp 98.0°F | Ht 68.0 in | Wt 176.0 lb

## 2011-11-26 DIAGNOSIS — Z23 Encounter for immunization: Secondary | ICD-10-CM

## 2011-11-26 DIAGNOSIS — S99919A Unspecified injury of unspecified ankle, initial encounter: Secondary | ICD-10-CM

## 2011-11-26 DIAGNOSIS — S99921A Unspecified injury of right foot, initial encounter: Secondary | ICD-10-CM

## 2011-11-26 NOTE — Patient Instructions (Addendum)
Patient was called with xray results that showed no foreign body in toe.LM that she could come in today and I could see if there were any small splinters causing the pain by making a small cut in the toe. Did not appear to have any infection.

## 2011-11-26 NOTE — Progress Notes (Signed)
  Subjective:    Patient ID: Erika Hancock, female    DOB: [birth year], 51 y.o.   MRN: [id number]  HPI 1 week ago patient stepped on big piece of wood with her right foot 2nd toe. Has done epson salt soaks and noticed some drainage. It has remained swollen and she still feels like something is in it. She is still able to walk on it but it does hurt. She denies any fever.  Review of Systems     Objective:   Physical Exam  Skin:       2nd right toe with single puncture wound. No blood or pus found and or able to get to come out with manipulation. There is some bruising and swelling around the base of 2nd toe that extends into the dorsal side of foot but no heat or erythema.           Assessment & Plan:  Right foot 2nd toe injury-Xray revealed no foreign body. Patient was called and told to come in if she wanted me to cut open slightly and look for small fragments of wood. Tdap was given. No signs of infection.

## 2011-11-27 ENCOUNTER — Telehealth: Payer: Self-pay | Admitting: *Deleted

## 2011-11-27 MED ORDER — SULFAMETHOXAZOLE-TRIMETHOPRIM 800-160 MG PO TABS: 1.0000 | ORAL_TABLET | Freq: Two times a day (BID) | ORAL | Status: AC

## 2011-11-27 NOTE — Telephone Encounter (Signed)
Pt was seen by [facility] yesterday. Pt is wanting to know if she should possibly be put on abx for her toe. States that it is still swollen and red. She states it is warm to touch. Please advise.

## 2011-11-27 NOTE — Telephone Encounter (Signed)
Will send over rx but needs appt tomorrow with Lesly Rubenstein ot make sure it doesn't need to be lanced.

## 2011-11-27 NOTE — Telephone Encounter (Signed)
Pt informed and has scheduled appt with Jade.

## 2011-11-28 ENCOUNTER — Ambulatory Visit: Payer: Medicare PPO | Admitting: Physician Assistant

## 2012-03-10 ENCOUNTER — Other Ambulatory Visit: Payer: Self-pay | Admitting: Family Medicine

## 2012-05-03 ENCOUNTER — Ambulatory Visit (INDEPENDENT_AMBULATORY_CARE_PROVIDER_SITE_OTHER): Payer: Medicare PPO | Admitting: Physician Assistant

## 2012-05-03 ENCOUNTER — Encounter: Payer: Self-pay | Admitting: Physician Assistant

## 2012-05-03 VITALS — BP 115/68 | HR 88 | Temp 97.7°F | Ht 68.0 in | Wt 163.0 lb

## 2012-05-03 DIAGNOSIS — L0291 Cutaneous abscess, unspecified: Secondary | ICD-10-CM

## 2012-05-03 MED ORDER — SULFAMETHOXAZOLE-TMP DS 800-160 MG PO TABS: 1.0000 | ORAL_TABLET | Freq: Two times a day (BID) | ORAL | Status: DC

## 2012-05-03 NOTE — Patient Instructions (Signed)
Use antibiotic for 7 days. Benadryl to help stop the itch.

## 2012-05-03 NOTE — Progress Notes (Signed)
  Subjective:    Patient ID: Erika Hancock, female    DOB: 29-Dec-1960, 51 y.o.   MRN: [id number]  HPI Patient presents to the clinic for lumps on the back of her head. Her and her daughter had lice 4 weeks ago and it has been very resistant to treatment. She has been treated 4 times since then and she still finds that her head itches. She wants to make sure she has no more lice and to have spots on back of head. She has noticed lumps about one week ago. She has tried to pop them on her on with no avail. She denies any fever, chills, or feeling bad. She has taken all the appropriate measures to clean her house and get rid of all linens. She has done nothing to make better other than treat for lice.    Review of Systems     Objective:   Physical Exam  Constitutional: She is oriented to person, place, and time. She appears well-developed and well-nourished.  HENT:  Head: Atraumatic.    Neurological: She is alert and oriented to person, place, and time.  Skin:       3 erythematous abscesses with center scabs. NO pus or drainage.   No lice on exam.  Psychiatric: She has a normal mood and affect. Her behavior is normal.          Assessment & Plan:  Abscess- Will treat with Bactrim for 7 days. Told patient to keep her hands away from lesions. I suspect that she infected her scalp due to trauma from itching her scalp so much. I encouraged her to use benadryl to help her stop scratching so much. Pt declined cbc to look at WBC. I educated her that lymphnodes around the infection may also be irritated/reactive and take a few weeks to go away. Call if worsening or not improving. I don't think we will have to drain but if they do not dissipate we may have to drain lesions.

## 2012-05-04 ENCOUNTER — Ambulatory Visit: Payer: Medicare PPO | Admitting: Family Medicine

## 2012-05-10 ENCOUNTER — Telehealth: Payer: Self-pay | Admitting: *Deleted

## 2012-05-10 MED ORDER — METHYLPREDNISOLONE 4 MG PO KIT: PACK | ORAL | Status: DC

## 2012-05-10 NOTE — Telephone Encounter (Signed)
Sounds like an allergic reaction. Are you still taking Bactrim? Concerned this might be a sulfa(bactrim allergy). Have you tried Benadryl? If not try Benadryl and let me know.

## 2012-05-10 NOTE — Telephone Encounter (Signed)
Pt informed

## 2012-05-10 NOTE — Telephone Encounter (Signed)
Need to start Zyrtec/Clartin daily for allergies. Will give medrol dose pack. If not improving needs to be seen.

## 2012-05-10 NOTE — Telephone Encounter (Signed)
Pt states she finished the bactrim and that she has taken benadryl. States she has round welps all over her body. She also states it may be the new dog she has. States her niece looked it up and that same kind of dog is the 2nd worse for allergies. Please advise.

## 2012-05-10 NOTE — Telephone Encounter (Signed)
Pt called stating that yesterday she has broken out in red patches all over her body and states they are itchy. She would like to know if you will call her in prednisone. Please advise.

## 2012-05-23 ENCOUNTER — Other Ambulatory Visit: Payer: Self-pay | Admitting: Family Medicine

## 2012-06-02 ENCOUNTER — Telehealth: Payer: Self-pay | Admitting: *Deleted

## 2012-06-02 MED ORDER — PERMETHRIN 5 % EX CREA: TOPICAL_CREAM | Freq: Once | CUTANEOUS | Status: DC

## 2012-06-02 MED ORDER — IVERMECTIN 0.5 % EX LOTN
1.0000 | TOPICAL_LOTION | Freq: Once | CUTANEOUS | Status: DC
Start: 2012-06-02 — End: 2012-12-21

## 2012-06-02 NOTE — Telephone Encounter (Signed)
Pt.notified

## 2012-06-02 NOTE — Telephone Encounter (Signed)
OK, new rx sent.  

## 2012-06-02 NOTE — Telephone Encounter (Signed)
Pt calls back again and request a stronger med for head lice be called in for her because she has really thick hair

## 2012-06-02 NOTE — Telephone Encounter (Signed)
Sent a prescription for [employer]  Requires one treatment. Just let her know the itching can persist for one to 2 weeks.

## 2012-06-02 NOTE — Telephone Encounter (Signed)
Pt calls back and states that the med called in was gonna cost 300.00 and needs something cheaper.

## 2012-06-02 NOTE — Telephone Encounter (Signed)
Pt calls and daughter has head lice and was given by her pediatrician spinosad. Erika Hancock now has it. Wants to know if you can call her in same thing to [facility] in [place] a big bottle of it

## 2012-07-30 ENCOUNTER — Other Ambulatory Visit: Payer: Self-pay | Admitting: Family Medicine

## 2012-08-05 ENCOUNTER — Other Ambulatory Visit: Payer: Self-pay | Admitting: Family Medicine

## 2012-11-24 ENCOUNTER — Other Ambulatory Visit: Payer: Self-pay | Admitting: Family Medicine

## 2012-12-21 ENCOUNTER — Ambulatory Visit (INDEPENDENT_AMBULATORY_CARE_PROVIDER_SITE_OTHER): Payer: Medicare PPO | Admitting: Family Medicine

## 2012-12-21 ENCOUNTER — Encounter: Payer: Self-pay | Admitting: Family Medicine

## 2012-12-21 VITALS — BP 136/88 | HR 97 | Wt 172.0 lb

## 2012-12-21 DIAGNOSIS — K5900 Constipation, unspecified: Secondary | ICD-10-CM

## 2012-12-21 DIAGNOSIS — Z1231 Encounter for screening mammogram for malignant neoplasm of breast: Secondary | ICD-10-CM

## 2012-12-21 DIAGNOSIS — R631 Polydipsia: Secondary | ICD-10-CM

## 2012-12-21 DIAGNOSIS — R1013 Epigastric pain: Secondary | ICD-10-CM

## 2012-12-21 DIAGNOSIS — E876 Hypokalemia: Secondary | ICD-10-CM

## 2012-12-21 DIAGNOSIS — I10 Essential (primary) hypertension: Secondary | ICD-10-CM

## 2012-12-21 LAB — CBC WITH DIFFERENTIAL/PLATELET
Basophils Absolute: 0.1 10*3/uL (ref 0.0–0.1)
Eosinophils Relative: 3 % (ref 0–5)
HCT: 40.5 % (ref 36.0–46.0)
Hemoglobin: 13.8 g/dL (ref 12.0–15.0)
Lymphocytes Relative: 33 % (ref 12–46)
Lymphs Abs: 3 10*3/uL (ref 0.7–4.0)
MCV: 95.3 fL (ref 78.0–100.0)
Monocytes Absolute: 0.5 10*3/uL (ref 0.1–1.0)
Monocytes Relative: 6 % (ref 3–12)
Neutro Abs: 5.3 10*3/uL (ref 1.7–7.7)
RDW: 13.8 % (ref 11.5–15.5)
WBC: 9.1 10*3/uL (ref 4.0–10.5)

## 2012-12-21 LAB — COMPLETE METABOLIC PANEL WITH GFR
ALT: 16 U/L (ref 0–35)
Albumin: 4.6 g/dL (ref 3.5–5.2)
Alkaline Phosphatase: 113 U/L (ref 39–117)
CO2: 27 mEq/L (ref 19–32)
GFR, Est Non African American: 65 mL/min
Glucose, Bld: 91 mg/dL (ref 70–99)
Potassium: 4 mEq/L (ref 3.5–5.3)
Sodium: 138 mEq/L (ref 135–145)
Total Protein: 7.5 g/dL (ref 6.0–8.3)

## 2012-12-21 LAB — HEMOGLOBIN A1C: Mean Plasma Glucose: 100 mg/dL (ref <117)

## 2012-12-21 MED ORDER — VITAMIN D (ERGOCALCIFEROL) 1.25 MG (50000 UNIT) PO CAPS: 50000.0000 [IU] | ORAL_CAPSULE | ORAL | Status: DC

## 2012-12-21 MED ORDER — POLYETHYLENE GLYCOL 3350 17 GM/SCOOP PO POWD: 17.0000 g | Freq: Every day | ORAL | Status: DC

## 2012-12-21 MED ORDER — SUCRALFATE 1 GM/10ML PO SUSP: 1.0000 g | Freq: Four times a day (QID) | ORAL | Status: DC

## 2012-12-21 NOTE — Patient Instructions (Addendum)
Try dexilant 60 mg in place of the omeprazole once a day before breakfast Take the carafate before each meal and at bedtime.  Start using MiraLax daily.

## 2012-12-21 NOTE — Progress Notes (Signed)
Subjective:    Patient ID: Erika Hancock, female    DOB: [birth year], 52 y.o.   MRN: [id number]  HPI ABd pain x 1 month in the epigastric area. Comes in waves.  Says had her GB rmoved 2 years ago and was told her pancreas was inflamed as well. Says it feels like heartburn. She has tried TUMs, antacids, and they do help relieve her pain. Radiates to her back She has gained a lot of weight in her abodmen in the last 3 weeks. Says overall feels bad.  Has been happening more often.  Started Geodon about 2 months ago for schizophrenia.  Omeprazole daily.  Has been constipated more than usual.   HTN -  Pt denies chest pain, SOB, dizziness, or heart palpitations.  Taking meds as directed w/o problems.  Denies medication side effects.  BP was runing in 140s last week.   Father died MI at 16.    She noticed she was having difficulty peeing. She ran out of her potassium for couple days and noticed her urine stream improved. Thus when she did get her potassium refill she started cutting in half and has been taking half a tab daily. Review of Systems Increased thrist since being on the Geodon.     BP 136/88  Pulse 97  Wt 172 lb (78.019 kg)  BMI 26.16 kg/m2    No Known Allergies  Past Medical History  Diagnosis Date  . MS (multiple sclerosis) 1995    relapsing  . Anxiety   . Depression   . Asthma   . Perimenopausal     Past Surgical History  Procedure Laterality Date  . Biopsy breast  1999, 2000    benign    History   Social History  . Marital Status: Widowed    Spouse Name: N/A    Number of Children: N/A  . Years of Education: N/A   Occupational History  . Not on file.   Social History Main Topics  . Smoking status: Current Every Day Smoker -- 1.00 packs/day    Types: Cigarettes    Last Attempt to Quit: 08/18/2008  . Smokeless tobacco: Not on file  . Alcohol Use: Yes     Comment: infrequent  . Drug Use: Not on file  . Sexually Active: Not on file     Comment: lab  tech, married, 2 children, walks 3 X week, fair diet.   Other Topics Concern  . Not on file   Social History Narrative  . No narrative on file    Family History  Problem Relation Age of Onset  . Heart disease Mother     died CHF  . Heart disease Father 16    heart attack  . Hypertension Father   . COPD Sister   . Glaucoma Sister   . Hypertension Sister   . Multiple sclerosis      cousin    Outpatient Encounter Prescriptions as of 12/21/2012  Medication Sig Dispense Refill  . albuterol (ACCUNEB) 1.25 MG/3ML nebulizer solution Take 1 ampule by nebulization every 6 (six) hours as needed.        . ALPRAZolam (XANAX) 0.5 MG tablet Take 0.5 mg by mouth 4 (four) times daily as needed.       . DULoxetine (CYMBALTA) 60 MG capsule Take 60 mg by mouth daily.      [name] FLUoxetine (PROZAC) 40 MG capsule take 1 capsule by mouth once daily  30 capsule  1  . fluticasone (FLONASE)  50 MCG/ACT nasal spray Place 2 sprays into the nose daily.        . furosemide (LASIX) 40 MG tablet Take 40 mg by mouth 2 (two) times daily.        [name] HYDROcodone-acetaminophen (VICODIN) 5-500 MG per tablet Take 1 tablet by mouth daily.        [name] omeprazole (PRILOSEC) 40 MG capsule Take 40 mg by mouth daily.        . Potassium Chloride (KLOR-CON 10 PO) Take by mouth.        . SYMBICORT 160-4.5 MCG/ACT inhaler inhale 2 puffs by mouth twice a day  10.2 g  1  . topiramate (TOPAMAX) 50 MG tablet Take 50 mg by mouth 2 (two) times daily.      . VENTOLIN HFA 108 (90 BASE) MCG/ACT inhaler inhale 2 puffs by mouth every 6 hours AS NEEDED for wheezing  18 g  0  . Vitamin D, Ergocalciferol, (DRISDOL) 50000 UNITS CAPS Take 1 capsule (50,000 Units total) by mouth every 7 (seven) days.  30 capsule  3  . ziprasidone (GEODON) 40 MG capsule Take 40 mg by mouth 2 (two) times daily with a meal.      . [DISCONTINUED] Ivermectin (SKLICE) 0.5 % LOTN Apply 1 application topically once.  117 g  0  . [DISCONTINUED] permethrin (ELIMITE) 5 % cream  Apply topically once. Apply to hair and scalp, leave for 10 minutes before rinsing.  60 g  0  . [DISCONTINUED] Vitamin D, Ergocalciferol, (DRISDOL) 50000 UNITS CAPS Take 50,000 Units by mouth every 7 (seven) days.        . polyethylene glycol powder (GLYCOLAX/MIRALAX) powder Take 17 g by mouth daily.  255 g  11  . sucralfate (CARAFATE) 1 GM/10ML suspension Take 10 mLs (1 g total) by mouth 4 (four) times daily.  420 mL  1  . [DISCONTINUED] cyanocobalamin (COBAL-1000) 1000 MCG/ML injection Inject 1,000 mcg into the muscle every 30 (thirty) days.        . [DISCONTINUED] methylPREDNISolone (MEDROL, PAK,) 4 MG tablet follow package directions  21 tablet  0  . [DISCONTINUED] sulfamethoxazole-trimethoprim (BACTRIM DS) 800-160 MG per tablet Take 1 tablet by mouth 2 (two) times daily. For 7 days.  14 tablet  0   No facility-administered encounter medications on file as of 12/21/2012.       Objective:   Physical Exam  Constitutional: She is oriented to person, place, and time. She appears well-developed and well-nourished.  HENT:  Head: Normocephalic and atraumatic.  Cardiovascular: Normal rate, regular rhythm and normal heart sounds.   Pulmonary/Chest: Effort normal and breath sounds normal.  Abdominal: Soft. Bowel sounds are normal. She exhibits no distension and no mass. There is tenderness. There is no rebound and no guarding.  Epigastric tenderness.  Neurological: She is alert and oriented to person, place, and time.  Skin: Skin is warm and dry.  Psychiatric: She has a normal mood and affect. Her behavior is normal.          Assessment & Plan:  Epigastric pain - GERD vs GI ulcer. I strong suspect a GI ulcer since she's getting radiation of pain into her mid back. We'll stop omeprazole and gave her samples of dexilant 60 mg a try for 2 weeks to see if this improves her symptoms. We'll also add Carafate before each melena bedtime. Also went over reflux hygiene and thinks to avoid. Follow up  in 2 weeks. She's not significantly better at that time  and consider testing for H. pylori in addition to refer him to GI for further evaluation.  HTN- Not well controlled recently, the blood pressure is okay today. We'll recheck again in 2 weeks. It may be secondary to her recent weight gain.  Polydipsia-may be secondary Geodon but she may also be developing diabetes. We will do a hemoglobin A1c to test her.  Constipation-recommend starting these MiraLax daily. She's been using it occasionally and does seem to help. Most likely secondary to Geodon.  Hypokalemia - due to recheck potassium level since she is now having her dose.

## 2013-01-04 ENCOUNTER — Ambulatory Visit: Payer: Medicare PPO | Admitting: Family Medicine

## 2013-01-24 ENCOUNTER — Encounter: Payer: Self-pay | Admitting: Family Medicine

## 2013-01-24 ENCOUNTER — Ambulatory Visit (INDEPENDENT_AMBULATORY_CARE_PROVIDER_SITE_OTHER): Payer: Medicare PPO | Admitting: Family Medicine

## 2013-01-24 VITALS — BP 115/80 | HR 108 | Wt 168.0 lb

## 2013-01-24 DIAGNOSIS — K5909 Other constipation: Secondary | ICD-10-CM

## 2013-01-24 DIAGNOSIS — K5900 Constipation, unspecified: Secondary | ICD-10-CM

## 2013-01-24 DIAGNOSIS — R1013 Epigastric pain: Secondary | ICD-10-CM

## 2013-01-24 MED ORDER — LUBIPROSTONE 24 MCG PO CAPS: 24.0000 ug | ORAL_CAPSULE | Freq: Two times a day (BID) | ORAL | Status: DC

## 2013-01-24 NOTE — Progress Notes (Signed)
  Subjective:    Patient ID: Erika Hancock, female    DOB: 22-Jan-1961, 52 y.o.   MRN: [id number]  HPI Ep[igastric pain - Has been able to stop the carafate and she is 75% better. She feels a lot of it was stress and anxiety. No Nausea. She went back to her omeprazole when ran out of the dexilant samples.    Still has chronic constipation. Has had most of her adult life. Has tried miralax and not working.  She has tried multple otc meds.    Reviewed labs as well.     Review of Systems     Objective:   Physical Exam  Constitutional: She is oriented to person, place, and time. She appears well-developed and well-nourished.  HENT:  Head: Normocephalic and atraumatic.  Cardiovascular: Normal rate, regular rhythm and normal heart sounds.   Pulmonary/Chest: Effort normal and breath sounds normal.  Abdominal: Soft. Bowel sounds are normal. She exhibits no distension and no mass. There is tenderness. There is no rebound and no guarding.  Tender in the LLQ.    Neurological: She is alert and oriented to person, place, and time.  Skin: Skin is warm and dry.  Psychiatric: She has a normal mood and affect. Her behavior is normal.          Assessment & Plan:  Epigastric pain - She is overall much better. Says reducing the stress in her life has made a really big difference.   Chornic constipation - will try Amitiza BID.  Given samples today.  Will call in rx if she likes it.  Please call back.   Encouragd her she is due screenign colonoscopy.  She has her mammo shceduled for tomorrow. She would like referral to gyn for her pap smear. Recommend the [facility] in our building.    reveiwed labs with her.

## 2013-01-25 ENCOUNTER — Ambulatory Visit: Payer: Medicare PPO

## 2013-02-08 ENCOUNTER — Ambulatory Visit: Payer: Medicare PPO

## 2013-03-10 ENCOUNTER — Ambulatory Visit: Payer: Medicare PPO

## 2013-04-27 ENCOUNTER — Other Ambulatory Visit: Payer: Self-pay | Admitting: Family Medicine

## 2013-04-27 ENCOUNTER — Other Ambulatory Visit: Payer: Self-pay | Admitting: Physician Assistant

## 2013-09-05 ENCOUNTER — Other Ambulatory Visit: Payer: Self-pay | Admitting: Family Medicine

## 2013-10-07 ENCOUNTER — Ambulatory Visit: Payer: Medicare PPO | Admitting: Physician Assistant

## 2013-10-07 ENCOUNTER — Ambulatory Visit: Payer: Medicare PPO | Admitting: Family Medicine

## 2013-10-10 ENCOUNTER — Ambulatory Visit (INDEPENDENT_AMBULATORY_CARE_PROVIDER_SITE_OTHER): Payer: Commercial Managed Care - HMO | Admitting: Family Medicine

## 2013-10-10 ENCOUNTER — Encounter: Payer: Self-pay | Admitting: Family Medicine

## 2013-10-10 VITALS — BP 125/78 | HR 91 | Temp 97.1°F

## 2013-10-10 DIAGNOSIS — F341 Dysthymic disorder: Secondary | ICD-10-CM

## 2013-10-10 DIAGNOSIS — F418 Other specified anxiety disorders: Secondary | ICD-10-CM

## 2013-10-10 DIAGNOSIS — S82892A Other fracture of left lower leg, initial encounter for closed fracture: Secondary | ICD-10-CM

## 2013-10-10 MED ORDER — FLUOXETINE HCL 20 MG PO TABS: 60.0000 mg | ORAL_TABLET | Freq: Every day | ORAL | Status: DC

## 2013-10-10 MED ORDER — ALPRAZOLAM 1 MG PO TABS: 1.0000 mg | ORAL_TABLET | Freq: Two times a day (BID) | ORAL | Status: DC | PRN

## 2013-10-10 NOTE — Progress Notes (Signed)
   Subjective:    Patient ID: Erika Hancock, female    DOB: 1960-10-15, 53 y.o.   MRN: [id number]  HPI  Here to reestablish care today. Here for followup depression and anxiety. She was previously being followed at the [facility] in [place]. She was seeing a psychiatrist there who was prescribing her fluoxetine and Xanax. She is off of Cymbalta. She did bring her bottles with her here today. She primarily complains of little pleasure in his in doing things more than half the days. Also sleep issues. Also complains of being fidgety more than half the days. Her anxiety levels are high as well. She rates herself as feeling nervous, anxious and on edge nearly every day and has trouble relaxing and letting go of things. She also describes herself as easily annoyed and irritable. She denies any thoughts of harming herself.  She is a single parent and her ex -husband passed away in April 16, 2023.   Review of Systems     Objective:   Physical Exam  Constitutional: She is oriented to person, place, and time. She appears well-developed and well-nourished.  HENT:  Head: Normocephalic and atraumatic.  Cardiovascular: Normal rate, regular rhythm and normal heart sounds.   Pulmonary/Chest: Effort normal and breath sounds normal.  Neurological: She is alert and oriented to person, place, and time.  Skin: Skin is warm and dry.  Psychiatric: She has a normal mood and affect. Her behavior is normal.          Assessment & Plan:  Depression/anxiety-PHQ 9 score of 13 today and gad 7 score of 18 today. Anxiety symptoms being the most prominent. She is almost out of her meds.  We'll increase fluoxetine to 60 mg. Followup in 2 months. Refilled Xanax today. Continue to use sparingly. Offered to refer her to our therapist here areas she says she will think about it and let me know at followup appointment.

## 2013-11-09 ENCOUNTER — Telehealth: Payer: Self-pay | Admitting: Family Medicine

## 2013-11-09 DIAGNOSIS — R519 Headache, unspecified: Secondary | ICD-10-CM

## 2013-11-09 DIAGNOSIS — G35 Multiple sclerosis: Secondary | ICD-10-CM

## 2013-11-09 DIAGNOSIS — R51 Headache: Secondary | ICD-10-CM

## 2013-11-09 NOTE — Telephone Encounter (Signed)
Referral placed.Erika Hancock  

## 2013-11-09 NOTE — Telephone Encounter (Signed)
I got a call from a rep at [facility] about [facility]  having problems getting a referral from us/I did not see one in workqueue. I spoke with rep at [facility] and patient is already established at this office and what they need is a referral from her pcp. She is being followed for Chronic headaches.

## 2013-12-08 ENCOUNTER — Other Ambulatory Visit: Payer: Self-pay | Admitting: Family Medicine

## 2013-12-13 ENCOUNTER — Encounter: Payer: Self-pay | Admitting: Family Medicine

## 2013-12-13 ENCOUNTER — Ambulatory Visit (INDEPENDENT_AMBULATORY_CARE_PROVIDER_SITE_OTHER): Payer: Medicare PPO | Admitting: Family Medicine

## 2013-12-13 VITALS — BP 136/83 | HR 87 | Wt 184.0 lb

## 2013-12-13 DIAGNOSIS — Z780 Asymptomatic menopausal state: Secondary | ICD-10-CM

## 2013-12-13 DIAGNOSIS — R635 Abnormal weight gain: Secondary | ICD-10-CM

## 2013-12-13 DIAGNOSIS — F411 Generalized anxiety disorder: Secondary | ICD-10-CM

## 2013-12-13 DIAGNOSIS — F322 Major depressive disorder, single episode, severe without psychotic features: Secondary | ICD-10-CM

## 2013-12-13 MED ORDER — TOPIRAMATE 200 MG PO TABS: 100.0000 mg | ORAL_TABLET | Freq: Two times a day (BID) | ORAL | Status: DC

## 2013-12-13 MED ORDER — ALPRAZOLAM 1 MG PO TABS: 1.0000 mg | ORAL_TABLET | Freq: Two times a day (BID) | ORAL | Status: DC | PRN

## 2013-12-13 NOTE — Progress Notes (Signed)
Subjective:    Patient ID: Erika Hancock, female    DOB: 1961-01-02, 53 y.o.   MRN: [id number]  HPI Followup depression and anxiety. I saw her several months ago and we increased her fluoxetine to 60 mg at bedtime it is her anxiety symptoms were still not well controlled. She complains of little interest and pleasure in doing things more than half the days as well as feeling down, feeling bad about herself, and having low energy. She also reports trouble concentrating. She's worried about having another MS flare. She's not getting any regular exercise right now. She has no thoughts of harming herself. She constantly feels nervous and on edge nearly everyday and has difficulty relaxing. She also feels that she's easily annoyed and irritable. She's having some difficulty with her daughter not doing well in school and not wanting to take care of herself. Her daughter has been suffering from depression since her biological father passed away. She is in counseling for this.  Last period was in March 2013. He is now postmenopausal and would like this to discuss hormone replacement therapy as an option. She has been steadily gaining weight. Used OTC estroven for hot flashes, wasn't helpful.  She is not exercising.      Review of Systems     BP 136/83  Pulse 87  Wt 184 lb (83.462 kg)    No Known Allergies  Past Medical History  Diagnosis Date  . MS (multiple sclerosis) 1995    relapsing  . Anxiety   . Depression   . Asthma   . Perimenopausal     Past Surgical History  Procedure Laterality Date  . Biopsy breast  1999, 2000    benign    History   Social History  . Marital Status: Widowed    Spouse Name: N/A    Number of Children: N/A  . Years of Education: N/A   Occupational History  . Not on file.   Social History Main Topics  . Smoking status: Current Every Day Smoker -- 1.00 packs/day    Types: Cigarettes    Last Attempt to Quit: 08/18/2008  . Smokeless tobacco:  Not on file  . Alcohol Use: Yes     Comment: infrequent  . Drug Use: Not on file  . 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   Other Topics Concern  . Not on file   Social History Narrative  . No narrative on file    Family History  Problem Relation Age of Onset  . Heart disease Mother     died CHF  . Heart disease Father 275    heart attack  . Hypertension Father   . COPD Sister   . Glaucoma Sister   . Hypertension Sister   . Multiple sclerosis      cousin    Outpatient Encounter Prescriptions as of 12/13/2013  Medication Sig  . albuterol (ACCUNEB) 1.25 MG/3ML nebulizer solution Take 1 ampule by nebulization every 6 (six) hours as needed.    . ALPRAZolam (XANAX) 1 MG tablet Take 1 tablet (1 mg total) by mouth 2 (two) times daily as needed for anxiety.  . Ascorbic Acid (VITAMIN C PO) Take by mouth.  . B Complex Vitamins (VITAMIN B COMPLEX PO) Take by mouth.  [name]. FLUoxetine (PROZAC) 20 MG tablet Take 3 tablets (60 mg total) by mouth daily.  . fluticasone (FLONASE) 50 MCG/ACT nasal  spray Place 2 sprays into the nose daily.    [name] FOLIC ACID PO Take by mouth.  . furosemide (LASIX) 40 MG tablet Take 40 mg by mouth 2 (two) times daily.    [name] HYDROcodone-acetaminophen (VICODIN) 5-500 MG per tablet Take 1 tablet by mouth daily.    [name] omeprazole (PRILOSEC) 40 MG capsule Take 40 mg by mouth daily.    . Potassium Chloride (KLOR-CON 10 PO) Take by mouth.    . SYMBICORT 160-4.5 MCG/ACT inhaler INHALE 2 PUFFS BY MOUTH TWICE DAILY  . VENTOLIN HFA 108 (90 BASE) MCG/ACT inhaler INHALE 2 PUFFS BY MOUTH EVERY 6 HOURS AS NEEDED FOR WHEEZING  . Vitamin D, Ergocalciferol, (DRISDOL) 50000 UNITS CAPS Take 1 capsule (50,000 Units total) by mouth every 7 (seven) days.  . [DISCONTINUED] ALPRAZolam (XANAX) 1 MG tablet Take 1 tablet (1 mg total) by mouth 2 (two) times daily as needed for anxiety.  . [DISCONTINUED] ALPRAZolam (XANAX) 1 MG tablet Take  1 tablet (1 mg total) by mouth 2 (two) times daily as needed for anxiety.  . topiramate (TOPAMAX) 200 MG tablet Take 0.5 tablets (100 mg total) by mouth 2 (two) times daily.  . [DISCONTINUED] MILK THISTLE PO Take by mouth.  . [DISCONTINUED] polyethylene glycol powder (GLYCOLAX/MIRALAX) powder Take 17 g by mouth daily.        Objective:   Physical Exam  Constitutional: She is oriented to person, place, and time. She appears well-developed and well-nourished.  HENT:  Head: Normocephalic and atraumatic.  Cardiovascular: Normal rate, regular rhythm and normal heart sounds.   Pulmonary/Chest: Effort normal and breath sounds normal.  Neurological: She is alert and oriented to person, place, and time.  Skin: Skin is warm and dry.  Psychiatric: She has a normal mood and affect. Her behavior is normal.          Assessment & Plan:  Depression/anxiety-PHQ 9 score of 16 today. Previous was 13. Gad 7 score of 21 today. Previous was 18. Uncontrolled. She feels it is secondary to stressors in her life. Please see note above. She's does not want to make any changes to her medication regimen.  Obesity - Recommend start My Fitness Pal to help her set calorie goals.  Also discussed the omportance of regular exercise.  Recheck thyroid. We discussed different tx options. She actually used to be on Topamax for her migraines and says it worked really well. She has been having daily headaches and she came off of it. She said she would like to just try going back on her Topamax for now and see if this also helps with her weight loss in addition to controlling her headaches. She did have some memory issues but says that she's continued to have memory issues even off of the Topamax.  Postmenopausal symptoms.- Discussed risks and benefits of hormone replacement therapy including increased risk of heart disease and breast cancer. She does have a family history of breast cancer. She need be monitored yearly with  mammograms. She would also have to quit smoking to be considered for treatment..    Time spent 25 minutes, greater than 50% spent counseling about depression/anxiety, obesity, and hormone replacement therapy.

## 2013-12-15 ENCOUNTER — Other Ambulatory Visit: Payer: Self-pay | Admitting: *Deleted

## 2013-12-15 LAB — TSH: TSH: 2.153 u[IU]/mL (ref 0.350–4.500)

## 2013-12-15 MED ORDER — BUDESONIDE-FORMOTEROL FUMARATE 160-4.5 MCG/ACT IN AERO: INHALATION_SPRAY | RESPIRATORY_TRACT | Status: DC

## 2013-12-15 MED ORDER — ALBUTEROL SULFATE HFA 108 (90 BASE) MCG/ACT IN AERS: INHALATION_SPRAY | RESPIRATORY_TRACT | Status: DC

## 2013-12-16 NOTE — Progress Notes (Signed)
Quick Note:  All labs are normal. ______ 

## 2013-12-28 ENCOUNTER — Other Ambulatory Visit: Payer: Self-pay | Admitting: Family Medicine

## 2014-02-10 ENCOUNTER — Ambulatory Visit (INDEPENDENT_AMBULATORY_CARE_PROVIDER_SITE_OTHER): Payer: Medicare PPO | Admitting: Family Medicine

## 2014-02-10 ENCOUNTER — Encounter: Payer: Self-pay | Admitting: Family Medicine

## 2014-02-10 VITALS — BP 115/66 | HR 93 | Ht 68.0 in | Wt 180.0 lb

## 2014-02-10 DIAGNOSIS — Z1231 Encounter for screening mammogram for malignant neoplasm of breast: Secondary | ICD-10-CM

## 2014-02-10 DIAGNOSIS — Z23 Encounter for immunization: Secondary | ICD-10-CM

## 2014-02-10 DIAGNOSIS — Z13220 Encounter for screening for lipoid disorders: Secondary | ICD-10-CM

## 2014-02-10 DIAGNOSIS — F322 Major depressive disorder, single episode, severe without psychotic features: Secondary | ICD-10-CM

## 2014-02-10 DIAGNOSIS — G35 Multiple sclerosis: Secondary | ICD-10-CM

## 2014-02-10 DIAGNOSIS — F411 Generalized anxiety disorder: Secondary | ICD-10-CM

## 2014-02-10 MED ORDER — ALPRAZOLAM 1 MG PO TABS: 1.0000 mg | ORAL_TABLET | Freq: Three times a day (TID) | ORAL | Status: DC | PRN

## 2014-02-10 NOTE — Progress Notes (Signed)
   Subjective:    Patient ID: Erika Hancock, female    DOB: 01-May-1961, 53 y.o.   MRN: [id number]  HPI F/U Mood - Depression and anxiety - Says she feels her mood is good. Still has low energy. Had an MS relapse about 3 weeks ago.  Has to get in with Dr. Loleta Chance.  Just finished 2 courses or prednisone.   She is under a lot of stress right now. She is a single parent and her daughter has been diagnosed with ADHD and possible personality disorder.. She is doing counseling through [place].  He just feels overwhelmed and stressed at times. She says somedays she does well in other day she just feels that her right hand. She does have a sister who is supportive. Unfortunately her daughter's father is deceased and no longer involved in her life.  Asthma - no recent exacerbations.  Still smoking.    Review of Systems     Objective:   Physical Exam  Constitutional: She is oriented to person, place, and time. She appears well-developed and well-nourished.  HENT:  Head: Normocephalic and atraumatic.  Cardiovascular: Normal rate, regular rhythm and normal heart sounds.   Pulmonary/Chest: Effort normal and breath sounds normal.  Neurological: She is alert and oriented to person, place, and time.  Skin: Skin is warm and dry.  Psychiatric: She has a normal mood and affect. Her behavior is normal.          Assessment & Plan:  Depression/Anxiety - Not well controlled. PHQ 9 score of 18 today. Gad 7 score of 22. Severe symptoms for both. There when you ask her to she's doing well she says she feels like she is. She doesn't want to adjust her fluoxetine.  Did increase her xanax.  Some months runs short and some months has some left over.    Asthma with tobacco abuse - will get pneumococcal 23 vaccine today.  MS- Will f/u with Dr. Loleta Chance.    Discussed need for screening mammogram. Order placed today.  Schedule for a physical with Pap smear in July.

## 2014-02-14 ENCOUNTER — Ambulatory Visit: Payer: Medicare PPO | Admitting: Family Medicine

## 2014-02-27 ENCOUNTER — Other Ambulatory Visit: Payer: Self-pay | Admitting: Family Medicine

## 2014-02-28 LAB — HEPATIC FUNCTION PANEL
ALT: 18 U/L (ref 7–35)
AST: 36 U/L — AB (ref 13–35)

## 2014-02-28 LAB — LIPID PANEL
Cholesterol: 146 mg/dL (ref 0–200)
HDL: 40 mg/dL (ref 35–70)
LDL Cholesterol: 57 mg/dL
LDL/HDL RATIO: 49
TRIGLYCERIDES: 247 mg/dL — AB (ref 40–160)

## 2014-02-28 LAB — HEMOGLOBIN A1C: HEMOGLOBIN A1C: 5.5 % (ref 4.0–6.0)

## 2014-02-28 LAB — BASIC METABOLIC PANEL
BUN: 19 mg/dL (ref 4–21)
Creatinine: 0.9 mg/dL (ref 0.5–1.1)
POTASSIUM: 3.4 mmol/L (ref 3.4–5.3)
SODIUM: 140 mmol/L (ref 137–147)

## 2014-03-09 LAB — ALBUMIN: Albumin: 4

## 2014-03-10 ENCOUNTER — Encounter: Payer: Medicare PPO | Admitting: Family Medicine

## 2014-03-13 ENCOUNTER — Encounter: Payer: Medicare PPO | Admitting: Family Medicine

## 2014-03-16 ENCOUNTER — Encounter: Payer: Self-pay | Admitting: Family Medicine

## 2014-03-16 ENCOUNTER — Other Ambulatory Visit: Payer: Self-pay | Admitting: Family Medicine

## 2014-04-06 ENCOUNTER — Encounter: Payer: Self-pay | Admitting: Family Medicine

## 2014-04-06 ENCOUNTER — Other Ambulatory Visit (HOSPITAL_COMMUNITY)
Admission: RE | Admit: 2014-04-06 | Discharge: 2014-04-06 | Disposition: A | Discharge status: Home / Self Care | Payer: Medicare PPO | Source: Ambulatory Visit | Attending: Family Medicine | Admitting: Family Medicine

## 2014-04-06 ENCOUNTER — Ambulatory Visit (INDEPENDENT_AMBULATORY_CARE_PROVIDER_SITE_OTHER): Payer: Medicare PPO | Admitting: Family Medicine

## 2014-04-06 VITALS — BP 115/70 | HR 98 | Ht 68.0 in | Wt 179.0 lb

## 2014-04-06 DIAGNOSIS — Z1151 Encounter for screening for human papillomavirus (HPV): Secondary | ICD-10-CM | POA: Insufficient documentation

## 2014-04-06 DIAGNOSIS — Z0000 Encounter for general adult medical examination without abnormal findings: Secondary | ICD-10-CM

## 2014-04-06 DIAGNOSIS — Z23 Encounter for immunization: Secondary | ICD-10-CM

## 2014-04-06 DIAGNOSIS — Z124 Encounter for screening for malignant neoplasm of cervix: Secondary | ICD-10-CM | POA: Insufficient documentation

## 2014-04-06 DIAGNOSIS — Z1231 Encounter for screening mammogram for malignant neoplasm of breast: Secondary | ICD-10-CM

## 2014-04-06 DIAGNOSIS — Z1211 Encounter for screening for malignant neoplasm of colon: Secondary | ICD-10-CM

## 2014-04-06 NOTE — Patient Instructions (Signed)
Keep up a regular exercise program and make sure you are eating a healthy diet Try to eat 4 servings of dairy a day, or if you are lactose intolerant take a calcium with vitamin D daily.  Your vaccines are up to date.   

## 2014-04-06 NOTE — Progress Notes (Signed)
  Subjective:     Erika Hancock is a 53 y.o. female and is here for a comprehensive physical exam. The patient reports no problems.  History   Social History  . Marital Status: Widowed    Spouse Name: N/A    Number of Children: N/A  . Years of Education: N/A   Occupational History  . Not on file.   Social History Main Topics  . Smoking status: Current Every Day Smoker -- 1.00 packs/day    Types: Cigarettes    Last Attempt to Quit: 08/18/2008  . Smokeless tobacco: Not on file  . Alcohol Use: Yes     Comment: infrequent  . Drug Use: Not on file  . 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   Other Topics Concern  . Not on file   Social History Narrative  . No narrative on file   Health Maintenance  Topic Date Due  . Pap Smear  03/21/2011  . Mammogram  07/12/2011  . Colonoscopy  07/12/2011  . Influenza Vaccine  03/18/2014  . Tetanus/tdap  11/25/2021    The following portions of the patient's history were reviewed and updated as appropriate: allergies, current medications, past family history, past medical history, past social history, past surgical history and problem list.  Review of Systems A comprehensive review of systems was negative.   Objective:    BP 115/70  Pulse 98  Ht 5\' 8"  (1.727 m)  Wt 179 lb (81.194 kg)  BMI 27.22 kg/m2 General appearance: alert, cooperative and appears stated age Head: Normocephalic, without obvious abnormality, atraumatic Eyes: , EOMi, PEERLA Ears: normal TM's and external ear canals both ears Nose: Nares normal. Septum midline. Mucosa normal. No drainage or sinus tenderness. Throat: lips, mucosa, and tongue normal; teeth and gums normal Neck: no adenopathy, no carotid bruit, no JVD, supple, symmetrical, trachea midline and thyroid not enlarged, symmetric, no tenderness/mass/nodules Back: symmetric, no curvature. ROM normal. No CVA tenderness. Lungs: clear to  auscultation bilaterally Breasts: normal appearance, no masses or tenderness Heart: regular rate and rhythm, S1, S2 normal, no murmur, click, rub or gallop Abdomen: soft, non-tender; bowel sounds normal; no masses,  no organomegaly Pelvic: cervix normal in appearance, external genitalia normal, no adnexal masses or tenderness, no cervical motion tenderness, rectovaginal septum normal, uterus normal size, shape, and consistency and vagina normal without discharge Extremities: extremities normal, atraumatic, no cyanosis or edema Pulses: 2+ and symmetric Skin: Skin color, texture, turgor normal. No rashes or lesions Lymph nodes: Cervical, supraclavicular, and axillary nodes normal. Neurologic: Alert and oriented X 3, normal strength and tone. Normal symmetric reflexes. Normal coordination and gait    Assessment:    Healthy female exam.      Plan:     See After Visit Summary for Counseling Recommendations  Keep up a regular exercise program and make sure you are eating a healthy diet Try to eat 4 servings of dairy a day, or if you are lactose intolerant take a calcium with vitamin D daily.  Your vaccines are up to date.   Due for mammogram. Will schedule for today.   Patient declined screening colonoscopy. Stool cards provided. Hopefully she will return them.  Flu shot given today.

## 2014-04-09 ENCOUNTER — Other Ambulatory Visit: Payer: Self-pay | Admitting: Family Medicine

## 2014-04-10 LAB — CYTOLOGY - PAP

## 2014-04-11 NOTE — Progress Notes (Signed)
Quick Note:  Call patient: Your Pap smear is normal. Repeat in 2-3 years. ______ 

## 2014-04-22 ENCOUNTER — Other Ambulatory Visit: Payer: Self-pay | Admitting: Family Medicine

## 2014-05-08 ENCOUNTER — Other Ambulatory Visit: Payer: Self-pay | Admitting: Family Medicine

## 2014-06-06 ENCOUNTER — Ambulatory Visit (INDEPENDENT_AMBULATORY_CARE_PROVIDER_SITE_OTHER): Payer: Medicare PPO | Admitting: Family Medicine

## 2014-06-06 ENCOUNTER — Encounter: Payer: Self-pay | Admitting: Family Medicine

## 2014-06-06 VITALS — BP 109/76 | HR 87 | Ht 68.0 in | Wt 178.0 lb

## 2014-06-06 DIAGNOSIS — N319 Neuromuscular dysfunction of bladder, unspecified: Secondary | ICD-10-CM

## 2014-06-06 DIAGNOSIS — F339 Major depressive disorder, recurrent, unspecified: Secondary | ICD-10-CM

## 2014-06-06 DIAGNOSIS — R635 Abnormal weight gain: Secondary | ICD-10-CM

## 2014-06-06 NOTE — Progress Notes (Signed)
   Subjective:    Patient ID: Erika Hancock, female    DOB: 1961/03/06, 53 y.o.   MRN: [id number]  HPI Depression - she is happy on her current regimen.  She is on fluoxetine 60mg .  Occ she takes takes an extra tab is her stress levels are high.  She uses her Xanax 3 times a day most days. She still under a lot of stress. 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 And her daughter is going to rule out hormonal changes which has been stressful.  Neurogenic bladder - Plans to start oxybutinin. Has referal for Urology as well.  The planes of having difficulty emptying her bladder. She will feel completely full and then only had a trickle of urine. It seems to wax and wane. She has had some frequency but no urgency or incontinence.  Abnormal weight gain - she's unhappy about recent weight gain. She feels that it may have been from the Topamax she quit taking it yesterday. She did discuss this with her neurologist. She says the back she joined a gym and has been exercising and trying to eat healthy.  Review of Systems     Objective:   Physical Exam  Constitutional: She is oriented to person, place, and time. She appears well-developed and well-nourished.  HENT:  Head: Normocephalic and atraumatic.  Cardiovascular: Normal rate, regular rhythm and normal heart sounds.   Pulmonary/Chest: Effort normal and breath sounds normal.  Neurological: She is alert and oriented to person, place, and time.  Skin: Skin is warm and dry.  Psychiatric: She has a normal mood and affect. Her behavior is normal.          Assessment & Plan:  Depression - GAD -7 score of 22.  Previous was 15.  PHQ- 9 score of 21, previous was 18.  Uncontrolled the patient just feels like it's external stressors. She does not want to change or adjust her medication. Followup in 3 months.  Abnormal weight gain-recommended tracking her calories. Recommend the Smart phone application my fitness  panel. Encourage her to continue with regular exercise. Now she is off the Topamax we will see if her weight increase over the next month.  The, typically Topamax causes weight loss, so the weight gain is a little unusual. Thyroid level was normal 6 months ago.  Neurogenic bladder-recommend trial of the oxybutynin and keep followup for referral with urology for further evaluation. With her history of MS neurogenic bladder is the most likely diagnosis.

## 2014-06-14 ENCOUNTER — Other Ambulatory Visit: Payer: Self-pay | Admitting: Family Medicine

## 2014-08-26 ENCOUNTER — Other Ambulatory Visit: Payer: Self-pay | Admitting: Family Medicine

## 2014-09-01 ENCOUNTER — Other Ambulatory Visit: Payer: Self-pay | Admitting: Family Medicine

## 2014-09-08 ENCOUNTER — Ambulatory Visit: Payer: Medicare PPO | Admitting: Family Medicine

## 2014-09-20 DIAGNOSIS — G35 Multiple sclerosis: Secondary | ICD-10-CM | POA: Diagnosis not present

## 2014-09-21 DIAGNOSIS — G35 Multiple sclerosis: Secondary | ICD-10-CM | POA: Diagnosis not present

## 2014-09-22 ENCOUNTER — Ambulatory Visit: Payer: Medicare PPO | Admitting: Family Medicine

## 2014-09-25 ENCOUNTER — Encounter: Payer: Self-pay | Admitting: Family Medicine

## 2014-09-25 ENCOUNTER — Ambulatory Visit (INDEPENDENT_AMBULATORY_CARE_PROVIDER_SITE_OTHER): Payer: Commercial Managed Care - HMO | Admitting: Family Medicine

## 2014-09-25 VITALS — BP 126/72 | HR 80 | Wt 182.0 lb

## 2014-09-25 DIAGNOSIS — F3130 Bipolar disorder, current episode depressed, mild or moderate severity, unspecified: Secondary | ICD-10-CM | POA: Diagnosis not present

## 2014-09-25 DIAGNOSIS — F411 Generalized anxiety disorder: Secondary | ICD-10-CM | POA: Diagnosis not present

## 2014-09-25 MED ORDER — ZIPRASIDONE HCL 20 MG PO CAPS: 20.0000 mg | ORAL_CAPSULE | Freq: Two times a day (BID) | ORAL | Status: DC

## 2014-09-25 MED ORDER — FLUOXETINE HCL 40 MG PO CAPS: 80.0000 mg | ORAL_CAPSULE | Freq: Every day | ORAL | Status: DC

## 2014-09-25 NOTE — Progress Notes (Signed)
   Subjective:    Patient ID: Erika Hancock, female    DOB: 11/16/1960, 54 y.o.   MRN: [id number]  HPI Here for follow-up bipolar disorder. She says her insurance will no longer cover psychiatry visits. She was previously on Geodon and would actually like to go back on this. Feels little interest or pleasure in doing things almost every day. Also complains of overeating and feeling bad about herself as well as feeling fidgety and restless. She also reports feeling nervous and on edge nearly every day as well as worrying excessively.  She has had thoughts of being better off dead several days of the week. She rates her symptoms as very difficult.  She feels more irritable.  She feels like her mood is really up and down. Daughter is in therapy too.  Says she felt much better on Geodon taking her Xanax as prescribed..  She is now on fluoxetine 80mg  and feels that has helped.   Review of Systems     Objective:   Physical Exam  Constitutional: She is oriented to person, place, and time. She appears well-developed and well-nourished.  HENT:  Head: Normocephalic and atraumatic.  Cardiovascular: Normal rate, regular rhythm and normal heart sounds.   Pulmonary/Chest: Effort normal and breath sounds normal.  Neurological: She is alert and oriented to person, place, and time.  Skin: Skin is warm and dry.  Psychiatric: She has a normal mood and affect. Her behavior is normal.          Assessment & Plan:  Bipolar disorder-PHQ 9 score of 21 today and Gad 7 score of 16. No significant change from previous. Discussed options. She really would like to restart Geodon and did well on it. We can certainly restart this will need to monitor her blood sugars when she does. I'll see her back in one month so that we can adjust her dose. We'll start with Geodon 20 mg twice a day. Follow-up in 4 weeks. She'll be due for her diabetic check at that time.  GAD- dong well on fluoxetine. Xanax not due yet.  Refilled 2.5 weeks ago.

## 2014-10-03 ENCOUNTER — Other Ambulatory Visit: Payer: Self-pay | Admitting: Family Medicine

## 2014-10-06 ENCOUNTER — Other Ambulatory Visit: Payer: Self-pay | Admitting: Family Medicine

## 2014-10-06 DIAGNOSIS — J4521 Mild intermittent asthma with (acute) exacerbation: Secondary | ICD-10-CM

## 2014-10-23 ENCOUNTER — Ambulatory Visit: Payer: Medicare PPO | Admitting: Family Medicine

## 2014-10-30 ENCOUNTER — Encounter: Payer: Self-pay | Admitting: Family Medicine

## 2014-10-30 ENCOUNTER — Ambulatory Visit (INDEPENDENT_AMBULATORY_CARE_PROVIDER_SITE_OTHER): Payer: Commercial Managed Care - HMO | Admitting: Family Medicine

## 2014-10-30 VITALS — BP 122/80 | HR 78 | Wt 182.0 lb

## 2014-10-30 DIAGNOSIS — R600 Localized edema: Secondary | ICD-10-CM | POA: Diagnosis not present

## 2014-10-30 DIAGNOSIS — Z720 Tobacco use: Secondary | ICD-10-CM

## 2014-10-30 DIAGNOSIS — F3160 Bipolar disorder, current episode mixed, unspecified: Secondary | ICD-10-CM

## 2014-10-30 DIAGNOSIS — R7309 Other abnormal glucose: Secondary | ICD-10-CM | POA: Diagnosis not present

## 2014-10-30 LAB — POCT GLYCOSYLATED HEMOGLOBIN (HGB A1C): HEMOGLOBIN A1C: 5.3

## 2014-10-30 NOTE — Progress Notes (Signed)
   Subjective:    Patient ID: Erika Hancock, female    DOB: [birth year], 54 y.o.   MRN: [id number]  HPI Bipolar D/O -here for one-month follow-up. We restarted her Geodon. She is doing very well and says this is the best she has felt in a while.  She does still complain of feeling fidgety and having difficulty with sleep and concentration. She still feels down several days of the week and still feels nervous several days a week.  She is resting better at night. She is feeling much more motivated.  Before she said it was just an effort to get up out of bed and get her daughter to school. Now she's actually been doing some spring cleaning. She feels like her sleep is much more restful.   Feet started swelling last week. She elevated them and then they felt better.  She has been up on her feet more.  She feels like her weight is up.  She denies any recent changes in her diet or increase in salt intake. No chest pain with the episode.  She says she really only smokes socially but with thinks she would like to try the Nicorette gum and use that instead of smoking.  Review of Systems     Objective:   Physical Exam  Constitutional: She is oriented to person, place, and time. She appears well-developed and well-nourished.  HENT:  Head: Normocephalic and atraumatic.  Neck: Neck supple. No thyromegaly present.  Cardiovascular: Normal rate, regular rhythm and normal heart sounds.   Pulmonary/Chest: Effort normal and breath sounds normal.  Musculoskeletal:  No LE edema    Lymphadenopathy:    She has no cervical adenopathy.  Neurological: She is alert and oriented to person, place, and time.  Skin: Skin is warm and dry.  Psychiatric: She has a normal mood and affect. Her behavior is normal.          Assessment & Plan:  Bipolar disorder-  Previous gad 7 score of 16, down to 8 today which is fantastic. She still rates her symptoms is somewhat difficult. PHQ 9 score previously 21 and down to  15 today. Overall think she's doing great continue the current regimen of Geodon and fluoxetine. Follow-up in 2 months.  LE edema-resolved. Certainly it could be related to the Geodon it is a potential side effect. I also be related to increase in salt into her diet. We discussed that she can surly take an extra Lasix tablet this happens but not to increase her Lasix on a regular basis.  Tobacco abuse-will give coupon for Nicorette gum.

## 2014-11-06 ENCOUNTER — Other Ambulatory Visit: Payer: Self-pay | Admitting: Family Medicine

## 2014-11-13 ENCOUNTER — Telehealth: Payer: Self-pay | Admitting: Family Medicine

## 2014-11-13 DIAGNOSIS — G35 Multiple sclerosis: Secondary | ICD-10-CM

## 2014-11-13 NOTE — Telephone Encounter (Signed)
A lady called from [facility] called to request a referral be put in for this patient to see Dr.Edward Hill on 11-23-14 ICD.10 code IS G35 And patient will need a Auth through [employer] so please post the above info in comments of referral

## 2014-11-13 NOTE — Telephone Encounter (Signed)
Referral placed.

## 2014-12-05 ENCOUNTER — Other Ambulatory Visit: Payer: Self-pay | Admitting: Family Medicine

## 2014-12-07 DIAGNOSIS — H469 Unspecified optic neuritis: Secondary | ICD-10-CM | POA: Diagnosis not present

## 2014-12-07 DIAGNOSIS — G35 Multiple sclerosis: Secondary | ICD-10-CM | POA: Diagnosis not present

## 2014-12-07 DIAGNOSIS — R269 Unspecified abnormalities of gait and mobility: Secondary | ICD-10-CM | POA: Diagnosis not present

## 2014-12-07 DIAGNOSIS — H9193 Unspecified hearing loss, bilateral: Secondary | ICD-10-CM | POA: Diagnosis not present

## 2014-12-20 ENCOUNTER — Other Ambulatory Visit: Payer: Self-pay | Admitting: Family Medicine

## 2015-01-08 ENCOUNTER — Other Ambulatory Visit: Payer: Self-pay | Admitting: Family Medicine

## 2015-01-19 ENCOUNTER — Ambulatory Visit: Payer: Medicare PPO | Admitting: Sports Medicine

## 2015-01-24 ENCOUNTER — Other Ambulatory Visit: Payer: Self-pay | Admitting: Family Medicine

## 2015-01-30 ENCOUNTER — Encounter: Payer: Self-pay | Admitting: Family Medicine

## 2015-01-30 ENCOUNTER — Ambulatory Visit (INDEPENDENT_AMBULATORY_CARE_PROVIDER_SITE_OTHER): Payer: Commercial Managed Care - HMO | Admitting: Family Medicine

## 2015-01-30 VITALS — BP 111/77 | HR 88 | Wt 180.0 lb

## 2015-01-30 DIAGNOSIS — F3130 Bipolar disorder, current episode depressed, mild or moderate severity, unspecified: Secondary | ICD-10-CM | POA: Diagnosis not present

## 2015-01-30 DIAGNOSIS — R600 Localized edema: Secondary | ICD-10-CM | POA: Diagnosis not present

## 2015-01-30 MED ORDER — FUROSEMIDE 40 MG PO TABS: 40.0000 mg | ORAL_TABLET | Freq: Two times a day (BID) | ORAL | Status: DC

## 2015-01-30 NOTE — Progress Notes (Signed)
   Subjective:    Patient ID: Erika Hancock, female    DOB: November 26, 1960, 54 y.o.   MRN: [id number]  HPI Follow-up bipolar disorder- She is doing well on Geodon. She feels like it has really helped her. She is no longer hearing voices.   She has lost about 2 lbs. Her daughter has been self- cutting.  They are going to family counseling.  She is o/w doing well. Planning a beach vacation.   Lower extremity edema-I had her increase her Lasix at her appointment last office visit. It has made a big difference in controlling her swelling. Still unclear if this could be a side effect from the Geodon or not. She has been trying to watch her salt intake.  Review of Systems     Objective:   Physical Exam  Constitutional: She is oriented to person, place, and time. She appears well-developed and well-nourished.  HENT:  Head: Normocephalic and atraumatic.  Neck: Neck supple. No thyromegaly present.  Cardiovascular: Normal rate, regular rhythm and normal heart sounds.   Pulmonary/Chest: Effort normal and breath sounds normal.  Lymphadenopathy:    She has no cervical adenopathy.  Neurological: She is alert and oriented to person, place, and time.  Skin: Skin is warm and dry.  Psychiatric: She has a normal mood and affect. Her behavior is normal.          Assessment & Plan:  Bipolar D/O - PHQ - 9 score of 11 today down from 15.  Continues to improve which is fantastic. Continue current regimen. Follow-up in 3 months.  LE edema - continue current regimen. New prescription written says that she can get 60 tabs of Lasix per month. We'll need to check a BMP just make sure that her potassium is stable. She does currently take 10 mEq daily.

## 2015-02-06 ENCOUNTER — Other Ambulatory Visit: Payer: Self-pay | Admitting: Family Medicine

## 2015-02-24 DIAGNOSIS — F10220 Alcohol dependence with intoxication, uncomplicated: Secondary | ICD-10-CM | POA: Diagnosis not present

## 2015-02-24 DIAGNOSIS — R40241 Glasgow coma scale score 13-15: Secondary | ICD-10-CM | POA: Diagnosis not present

## 2015-02-24 DIAGNOSIS — Y909 Presence of alcohol in blood, level not specified: Secondary | ICD-10-CM | POA: Diagnosis not present

## 2015-03-12 ENCOUNTER — Other Ambulatory Visit: Payer: Self-pay | Admitting: Family Medicine

## 2015-03-15 DIAGNOSIS — G35 Multiple sclerosis: Secondary | ICD-10-CM | POA: Diagnosis not present

## 2015-03-15 DIAGNOSIS — H469 Unspecified optic neuritis: Secondary | ICD-10-CM | POA: Diagnosis not present

## 2015-03-15 DIAGNOSIS — R269 Unspecified abnormalities of gait and mobility: Secondary | ICD-10-CM | POA: Diagnosis not present

## 2015-03-15 DIAGNOSIS — H9193 Unspecified hearing loss, bilateral: Secondary | ICD-10-CM | POA: Diagnosis not present

## 2015-03-31 ENCOUNTER — Other Ambulatory Visit: Payer: Self-pay | Admitting: Family Medicine

## 2015-04-15 ENCOUNTER — Other Ambulatory Visit: Payer: Self-pay | Admitting: Family Medicine

## 2015-04-17 DIAGNOSIS — M4802 Spinal stenosis, cervical region: Secondary | ICD-10-CM | POA: Diagnosis not present

## 2015-04-17 DIAGNOSIS — G35 Multiple sclerosis: Secondary | ICD-10-CM | POA: Diagnosis not present

## 2015-04-17 DIAGNOSIS — R930 Abnormal findings on diagnostic imaging of skull and head, not elsewhere classified: Secondary | ICD-10-CM | POA: Diagnosis not present

## 2015-04-17 DIAGNOSIS — M47812 Spondylosis without myelopathy or radiculopathy, cervical region: Secondary | ICD-10-CM | POA: Diagnosis not present

## 2015-04-17 DIAGNOSIS — M5032 Other cervical disc degeneration, mid-cervical region: Secondary | ICD-10-CM | POA: Diagnosis not present

## 2015-05-02 ENCOUNTER — Encounter: Payer: Self-pay | Admitting: Family Medicine

## 2015-05-02 ENCOUNTER — Ambulatory Visit (INDEPENDENT_AMBULATORY_CARE_PROVIDER_SITE_OTHER): Payer: Commercial Managed Care - HMO | Admitting: Family Medicine

## 2015-05-02 VITALS — BP 120/84 | HR 80 | Wt 178.0 lb

## 2015-05-02 DIAGNOSIS — F3175 Bipolar disorder, in partial remission, most recent episode depressed: Secondary | ICD-10-CM

## 2015-05-02 DIAGNOSIS — Z1231 Encounter for screening mammogram for malignant neoplasm of breast: Secondary | ICD-10-CM

## 2015-05-02 DIAGNOSIS — Z803 Family history of malignant neoplasm of breast: Secondary | ICD-10-CM

## 2015-05-02 DIAGNOSIS — R600 Localized edema: Secondary | ICD-10-CM | POA: Diagnosis not present

## 2015-05-02 MED ORDER — ZIPRASIDONE HCL 20 MG PO CAPS: ORAL_CAPSULE | ORAL | Status: DC

## 2015-05-02 NOTE — Progress Notes (Signed)
Subjective:    Patient ID: Erika Hancock, female    DOB: August 02, 1961, 54 y.o.   MRN: [id number]  HPI  F/U bipolar 1 D/O - PHQ - 9 score of 8  today, previous of 11.  She does complain of feeling down several days of the week and having low energy. Also reports trouble concentrating more than half the days. No thoughts of wanting to harm herself. She states it struggling with  LE edema - we had increased her lasix last OV and she was supposed to go up on to lab for BMP.     Review of Systems   BP 120/84 mmHg  Pulse 80  Wt 178 lb (80.74 kg)    No Known Allergies  Past Medical History  Diagnosis Date  . MS (multiple sclerosis) 1995    relapsing  . Anxiety   . Depression   . Asthma   . Perimenopausal     Past Surgical History  Procedure Laterality Date  . Biopsy breast  1999, 2000    benign    Social History   Social History  . Marital Status: Widowed    Spouse Name: N/A  . Number of Children: N/A  . Years of Education: N/A   Occupational History  . Not on file.   Social History Main Topics  . Smoking status: Current Every Day Smoker -- 1.00 packs/day    Types: Cigarettes    Last Attempt to Quit: 08/18/2008  . Smokeless tobacco: Not on file  . Alcohol Use: Yes     Comment: infrequent  . Drug Use: Not on file  . 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   Other Topics Concern  . Not on file   Social History Narrative    Family History  Problem Relation Age of Onset  . Heart disease Mother     died CHF  . Heart disease Father 69    heart attack  . Hypertension Father   . COPD Sister   . Glaucoma Sister   . Hypertension Sister   . Multiple sclerosis      cousin    Outpatient Encounter Prescriptions as of 05/02/2015  Medication Sig  . albuterol (ACCUNEB) 1.25 MG/3ML nebulizer solution Take 1 ampule by nebulization every 6 (six) hours as needed.    . ALPRAZolam (XANAX) 1 MG tablet  Take 1 tablet (1 mg total) by mouth 3 (three) times daily as needed for anxiety. Patient needs to schedule a follow up appointment before more refills.  . Ascorbic Acid (VITAMIN C PO) Take by mouth.  . B Complex Vitamins (VITAMIN B COMPLEX PO) Take by mouth.  [name] FLUoxetine (PROZAC) 40 MG capsule take 2 capsules by mouth daily  . fluticasone (FLONASE) 50 MCG/ACT nasal spray Place 2 sprays into the nose daily.    [name] FOLIC ACID PO Take by mouth.  . furosemide (LASIX) 40 MG tablet Take 1 tablet (40 mg total) by mouth 2 (two) times daily.  [name] HYDROcodone-acetaminophen (VICODIN) 5-500 MG per tablet Take 1 tablet by mouth daily.    [name] omeprazole (PRILOSEC) 40 MG capsule Take 40 mg by mouth daily.    [name] oxybutynin (DITROPAN-XL) 5 MG 24 hr tablet   . potassium chloride (K-DUR,KLOR-CON) 10 MEQ tablet   . SYMBICORT 160-4.5 MCG/ACT inhaler INHALE 2 PUFFS BY MOUTH TWICE DAILY  . topiramate (TOPAMAX) 200 MG tablet take 1/2 tablet by  mouth twice a day  . VENTOLIN HFA 108 (90 BASE) MCG/ACT inhaler inhale 2 puffs by mouth every 6 hours if needed for wheezing  . ziprasidone (GEODON) 20 MG capsule take 1 capsule by mouth twice a day with meals  . [DISCONTINUED] Potassium Chloride (KLOR-CON 10 PO) Take by mouth.    . [DISCONTINUED] Vitamin D, Ergocalciferol, (DRISDOL) 50000 UNITS CAPS Take 1 capsule (50,000 Units total) by mouth every 7 (seven) days.  . [DISCONTINUED] ziprasidone (GEODON) 20 MG capsule take 1 capsule by mouth twice a day with meals  . [DISCONTINUED] VENTOLIN HFA 108 (90 BASE) MCG/ACT inhaler INHALE 2 PUFFS BY MOUTH EVERY 6 HOURS IF NEEDED FOR WHEEZING   No facility-administered encounter medications on file as of 05/02/2015.          Objective:   Physical Exam  Constitutional: She is oriented to person, place, and time. She appears well-developed and well-nourished.  HENT:  Head: Normocephalic and atraumatic.  Cardiovascular: Normal rate, regular rhythm and normal heart sounds.    Pulmonary/Chest: Effort normal and breath sounds normal.  Neurological: She is alert and oriented to person, place, and time.  Skin: Skin is warm and dry.  Psychiatric: She has a normal mood and affect. Her behavior is normal.          Assessment & Plan:  Bipolar 1 disorder-overall she is actually doing very well. PHQ 9 score of 11 today. Continue current regimen. We discussed that she try to G[facility] to see if she can find cheaper pricing on her Geodon. Most month she's pain ever $100 for which I thank is extremely expensive for generic medication. She did check at [facility] and says they were offering at $75. Also encouraged her to check with a smaller private pharmacy as well as our hospital pharmacy which can offer cheaper pricing. She may even want to check into her health insurance his mail order. Can easily changes were 90 day supply if needed.  Family history of breast cancer in her grandmother and aunt on the mother's side. Strongly recommend that we get a screening mammogram ordered. She had biopsies done about 14 years ago and has not had a mammogram since.  Edema-she exited not increase her Lasix since I last saw her because she just knew she can come and get her blood work checked. She plans to start it soon. I reprepped her lab slip and she will go when she's been on her new regimen for 1-2 weeks.

## 2015-05-08 ENCOUNTER — Other Ambulatory Visit: Payer: Self-pay | Admitting: Family Medicine

## 2015-05-14 ENCOUNTER — Other Ambulatory Visit: Payer: Self-pay | Admitting: Family Medicine

## 2015-06-04 ENCOUNTER — Telehealth: Payer: Self-pay

## 2015-06-04 DIAGNOSIS — G35 Multiple sclerosis: Secondary | ICD-10-CM

## 2015-06-04 NOTE — Telephone Encounter (Signed)
Referral place. Patient has [facility].

## 2015-06-07 DIAGNOSIS — R269 Unspecified abnormalities of gait and mobility: Secondary | ICD-10-CM | POA: Diagnosis not present

## 2015-06-07 DIAGNOSIS — G35 Multiple sclerosis: Secondary | ICD-10-CM | POA: Diagnosis not present

## 2015-06-07 DIAGNOSIS — H469 Unspecified optic neuritis: Secondary | ICD-10-CM | POA: Diagnosis not present

## 2015-06-07 DIAGNOSIS — H9193 Unspecified hearing loss, bilateral: Secondary | ICD-10-CM | POA: Diagnosis not present

## 2015-06-14 ENCOUNTER — Other Ambulatory Visit: Payer: Self-pay | Admitting: Family Medicine

## 2015-07-10 ENCOUNTER — Other Ambulatory Visit: Payer: Self-pay | Admitting: Family Medicine

## 2015-07-16 ENCOUNTER — Other Ambulatory Visit: Payer: Self-pay | Admitting: Family Medicine

## 2015-07-18 DIAGNOSIS — Z79899 Other long term (current) drug therapy: Secondary | ICD-10-CM | POA: Diagnosis not present

## 2015-08-01 ENCOUNTER — Encounter: Payer: Self-pay | Admitting: Family Medicine

## 2015-08-01 ENCOUNTER — Ambulatory Visit (INDEPENDENT_AMBULATORY_CARE_PROVIDER_SITE_OTHER): Payer: Commercial Managed Care - HMO | Admitting: Family Medicine

## 2015-08-01 ENCOUNTER — Telehealth: Payer: Self-pay

## 2015-08-01 VITALS — BP 153/88 | HR 80 | Wt 179.0 lb

## 2015-08-01 DIAGNOSIS — F3130 Bipolar disorder, current episode depressed, mild or moderate severity, unspecified: Secondary | ICD-10-CM | POA: Diagnosis not present

## 2015-08-01 NOTE — Telephone Encounter (Signed)
I spoke with patient and she is going to go to the ED tomorrow. She cannot leave her daughter home alone tonight. I advised her she could set up something with outpatient. She said she rather go to the ED.

## 2015-08-01 NOTE — Telephone Encounter (Signed)
Please call patient: She was having suicidal thoughts. She wasn't actively suicidal but I was concerned enough that I felt like she should be seen. We can also work on trying to see if we can get her in urgently as an outpatient this week. I think she would be safe to wait a day or 2 if needed. Certainly if she feels worse she needs to go the emergency department immediately.

## 2015-08-01 NOTE — Telephone Encounter (Signed)
Patient called and states she will not be going to the ED. She stated it would cost too much and take too long.

## 2015-08-01 NOTE — Progress Notes (Signed)
   Subjective:    Patient ID: Erika Hancock, female    DOB: 29-Jan-1961, 54 y.o.   MRN: [id number]  HPI She is here today to follow-up for her bipolar disorder-she's been doing really well until about a month ago. She just unfortunately having a lot of problems with her 67 year old daughter. She is raising her as a single mom. She said sure daughter has become very defined. She's called the police on her and told them that she wanted to kill her.  Her daughter is now smoking marijuana. She doesn't think she sexually active. She just at the point where she's completely stressed and overwhelmed and doesn't know what to do. She said now she's even anxious about her daughter comes home from school. I asked if she had any family members that could help out with her daughter for a period of time to relieve her with some stress and she said that no one wants to take her because of her bad behavior. She said she even struggles getting her to school. Her ex-husband, her daughter's father, died about 2 years ago and she feels like she is still struggling with that. She's been taking her daughter to a therapist and counselor consistently for the last couple of years.  She says at this point she has been having some thoughts about being better off dead and not being here. She has had thoughts of wanting to harm herself almost every day. She feels extremely depressed every day and anxious. She is not sleeping well at all and just feels completely exhausted physically.   Review of Systems     Objective:   Physical Exam  Constitutional: She is oriented to person, place, and time. She appears well-developed and well-nourished.  HENT:  Head: Normocephalic and atraumatic.  Eyes: Conjunctivae and EOM are normal.  Cardiovascular: Normal rate.   Pulmonary/Chest: Effort normal.  Neurological: She is alert and oriented to person, place, and time.  Skin: Skin is dry. No pallor.  Psychiatric: She has a normal  mood and affect. Her behavior is normal.  Vitals reviewed.         Assessment & Plan:  Bipolar 1 disorder-symptoms uncontrolled. PHQ 9 score of 27 today and Gad 7 score of 27 as well. Because of active thoughts of 1 to harm herself we discussed her going to the evaluated to see if she might need to be admitted inpatient. She would prefer to go to the [facility] at [facility]. We called and they recommended that she go through the emergency department to be evaluated. She agreed to go and feels safe to take herself. She unfortunately has had to be admitted before.  She does have a roommate in the home who can help watch after her daughter if she does get admitted.

## 2015-08-08 DIAGNOSIS — T50902D Poisoning by unspecified drugs, medicaments and biological substances, intentional self-harm, subsequent encounter: Secondary | ICD-10-CM | POA: Diagnosis not present

## 2015-08-08 DIAGNOSIS — F609 Personality disorder, unspecified: Secondary | ICD-10-CM | POA: Diagnosis not present

## 2015-08-08 DIAGNOSIS — T424X2A Poisoning by benzodiazepines, intentional self-harm, initial encounter: Secondary | ICD-10-CM | POA: Diagnosis not present

## 2015-08-08 DIAGNOSIS — R001 Bradycardia, unspecified: Secondary | ICD-10-CM | POA: Diagnosis not present

## 2015-08-08 DIAGNOSIS — F17200 Nicotine dependence, unspecified, uncomplicated: Secondary | ICD-10-CM | POA: Diagnosis not present

## 2015-08-08 DIAGNOSIS — R410 Disorientation, unspecified: Secondary | ICD-10-CM | POA: Diagnosis not present

## 2015-08-08 DIAGNOSIS — I4581 Long QT syndrome: Secondary | ICD-10-CM | POA: Diagnosis not present

## 2015-08-08 DIAGNOSIS — T1491 Suicide attempt: Secondary | ICD-10-CM | POA: Diagnosis not present

## 2015-08-08 DIAGNOSIS — I10 Essential (primary) hypertension: Secondary | ICD-10-CM | POA: Diagnosis not present

## 2015-08-08 DIAGNOSIS — F0390 Unspecified dementia without behavioral disturbance: Secondary | ICD-10-CM | POA: Diagnosis not present

## 2015-08-08 DIAGNOSIS — T887XXA Unspecified adverse effect of drug or medicament, initial encounter: Secondary | ICD-10-CM | POA: Diagnosis not present

## 2015-08-08 DIAGNOSIS — F419 Anxiety disorder, unspecified: Secondary | ICD-10-CM | POA: Diagnosis not present

## 2015-08-08 DIAGNOSIS — E876 Hypokalemia: Secondary | ICD-10-CM | POA: Diagnosis not present

## 2015-08-08 DIAGNOSIS — F29 Unspecified psychosis not due to a substance or known physiological condition: Secondary | ICD-10-CM | POA: Diagnosis not present

## 2015-08-08 DIAGNOSIS — Z609 Problem related to social environment, unspecified: Secondary | ICD-10-CM | POA: Diagnosis not present

## 2015-08-08 DIAGNOSIS — F329 Major depressive disorder, single episode, unspecified: Secondary | ICD-10-CM | POA: Diagnosis not present

## 2015-08-08 DIAGNOSIS — F319 Bipolar disorder, unspecified: Secondary | ICD-10-CM | POA: Diagnosis not present

## 2015-08-09 ENCOUNTER — Telehealth: Payer: Self-pay | Admitting: Family Medicine

## 2015-08-09 DIAGNOSIS — I4581 Long QT syndrome: Secondary | ICD-10-CM | POA: Diagnosis not present

## 2015-08-09 NOTE — Telephone Encounter (Signed)
Received phone call from Pt's sister, Tresa Endo, informing our office that Pt attempted suicide. Sister reports this is the Pt's 8th suicide attempt. She overdosed on the Xanax Rx that was written by PCP. Sister reports the Pt has had a "drug and alcohol problem since she was 54 years old." Sister reports Pt is currently at an inpatient treatment facility. Will route to PCP to inform, Pt does have upcoming appt scheduled.

## 2015-08-13 NOTE — Telephone Encounter (Signed)
I am thankful for the phone call.  I am going to remove Xanax from her med list for now.  Hopefully we can get her schedule with pyschiatry at discharge.

## 2015-08-15 DIAGNOSIS — F319 Bipolar disorder, unspecified: Secondary | ICD-10-CM | POA: Diagnosis not present

## 2015-08-15 DIAGNOSIS — F17210 Nicotine dependence, cigarettes, uncomplicated: Secondary | ICD-10-CM | POA: Diagnosis not present

## 2015-08-15 DIAGNOSIS — T50902D Poisoning by unspecified drugs, medicaments and biological substances, intentional self-harm, subsequent encounter: Secondary | ICD-10-CM | POA: Diagnosis not present

## 2015-08-15 DIAGNOSIS — G43709 Chronic migraine without aura, not intractable, without status migrainosus: Secondary | ICD-10-CM | POA: Diagnosis not present

## 2015-08-15 DIAGNOSIS — J45909 Unspecified asthma, uncomplicated: Secondary | ICD-10-CM | POA: Diagnosis not present

## 2015-08-15 DIAGNOSIS — F3181 Bipolar II disorder: Secondary | ICD-10-CM | POA: Diagnosis not present

## 2015-08-15 DIAGNOSIS — I4581 Long QT syndrome: Secondary | ICD-10-CM | POA: Diagnosis not present

## 2015-08-15 DIAGNOSIS — G35 Multiple sclerosis: Secondary | ICD-10-CM | POA: Diagnosis not present

## 2015-08-15 DIAGNOSIS — R45851 Suicidal ideations: Secondary | ICD-10-CM | POA: Diagnosis not present

## 2015-08-15 DIAGNOSIS — N390 Urinary tract infection, site not specified: Secondary | ICD-10-CM | POA: Diagnosis not present

## 2015-08-15 DIAGNOSIS — F4310 Post-traumatic stress disorder, unspecified: Secondary | ICD-10-CM | POA: Diagnosis not present

## 2015-08-15 DIAGNOSIS — I10 Essential (primary) hypertension: Secondary | ICD-10-CM | POA: Diagnosis not present

## 2015-08-15 DIAGNOSIS — F329 Major depressive disorder, single episode, unspecified: Secondary | ICD-10-CM | POA: Diagnosis not present

## 2015-08-15 DIAGNOSIS — T1491 Suicide attempt: Secondary | ICD-10-CM | POA: Diagnosis not present

## 2015-08-18 DIAGNOSIS — F3181 Bipolar II disorder: Secondary | ICD-10-CM | POA: Diagnosis not present

## 2015-08-19 DIAGNOSIS — N390 Urinary tract infection, site not specified: Secondary | ICD-10-CM | POA: Diagnosis not present

## 2015-08-19 DIAGNOSIS — F4310 Post-traumatic stress disorder, unspecified: Secondary | ICD-10-CM | POA: Diagnosis not present

## 2015-08-19 DIAGNOSIS — F17210 Nicotine dependence, cigarettes, uncomplicated: Secondary | ICD-10-CM | POA: Diagnosis not present

## 2015-08-19 DIAGNOSIS — I10 Essential (primary) hypertension: Secondary | ICD-10-CM | POA: Diagnosis not present

## 2015-08-19 DIAGNOSIS — R45851 Suicidal ideations: Secondary | ICD-10-CM | POA: Diagnosis not present

## 2015-08-19 DIAGNOSIS — G43709 Chronic migraine without aura, not intractable, without status migrainosus: Secondary | ICD-10-CM | POA: Diagnosis not present

## 2015-08-19 DIAGNOSIS — J45909 Unspecified asthma, uncomplicated: Secondary | ICD-10-CM | POA: Diagnosis not present

## 2015-08-19 DIAGNOSIS — G35 Multiple sclerosis: Secondary | ICD-10-CM | POA: Diagnosis not present

## 2015-08-19 DIAGNOSIS — F3181 Bipolar II disorder: Secondary | ICD-10-CM | POA: Diagnosis not present

## 2015-08-29 ENCOUNTER — Ambulatory Visit (INDEPENDENT_AMBULATORY_CARE_PROVIDER_SITE_OTHER): Payer: Commercial Managed Care - HMO | Admitting: Family Medicine

## 2015-08-29 ENCOUNTER — Encounter: Payer: Self-pay | Admitting: Family Medicine

## 2015-08-29 VITALS — BP 108/52 | HR 98 | Wt 184.0 lb

## 2015-08-29 DIAGNOSIS — F3130 Bipolar disorder, current episode depressed, mild or moderate severity, unspecified: Secondary | ICD-10-CM

## 2015-08-29 MED ORDER — CLONAZEPAM 0.5 MG PO TABS: 0.5000 mg | ORAL_TABLET | Freq: Two times a day (BID) | ORAL | Status: DC | PRN

## 2015-08-29 MED ORDER — HYDROXYZINE PAMOATE 25 MG PO CAPS: 25.0000 mg | ORAL_CAPSULE | Freq: Three times a day (TID) | ORAL | Status: DC | PRN

## 2015-08-29 MED ORDER — LITHIUM CARBONATE ER 450 MG PO TBCR: 450.0000 mg | EXTENDED_RELEASE_TABLET | Freq: Two times a day (BID) | ORAL | Status: DC

## 2015-08-29 NOTE — Progress Notes (Signed)
   Subjective:    Patient ID: Erika Hancock, female    DOB: 07/13/1961, 55 y.o.   MRN: [id number]  HPI   Hospital F/U for suicide attempt.  She has a history of bipolar disorder and major depressive disorder. She took a whole bottle of Xanax. Is admitted to [facility]. Was admitted for 14 days. She was admitted on December 21 and discharged home on December 28.  Switched from Prozac and Geodon to lorazepam, hydroxyzine and lithium.  She does feel like this is doing better for her. She said she's not having as many dark thoughts that she was having on the Geodon. She still complains of decreased pleasure in doing things several days of the week and feels like she is overeating. She still feels very negatively about herself. Also complains of feeling nervous and on edge nearly every day. She has been back at home for the last week and says she is doing well so far. She said the interactions with her daughter have actually been better. And the roommate that was previously there has actually moved out. She says this is actually a good thing and has really reduced some stressors for her. She still feels a little bit shaky. She still having some issues with sleep but says it's not severe.  Note, she did have some QT prolongation on her EKG. This seemed to resolve after admission once she was taken off of Geodon.   Review of Systems     Objective:   Physical Exam  Constitutional: She is oriented to person, place, and time. She appears well-developed and well-nourished.  HENT:  Head: Normocephalic and atraumatic.  Eyes: Conjunctivae and EOM are normal.  Cardiovascular: Normal rate.   Pulmonary/Chest: Effort normal.  Neurological: She is alert and oriented to person, place, and time.  Skin: Skin is dry. No pallor.  Psychiatric: She has a normal mood and affect. Her behavior is normal.  Vitals reviewed.         Assessment & Plan:  Bipolar disorder most recent episode depression-PHQ  9 score of 12 today and again 7 score of 12. We discussed the importance of establishing care with a psychiatrist.  Because her medication regimen has been adjusted I would feel more comfortable if psychiatry took over her medication management until things are well adjusted. I think she would benefit from some therapy/counseling as well and they could certainly offer her that [facility]. She plans to call and make an appointment at their office since that's where her daughter is also seen in she feels comfortable there. I encouraged her to call us as soon as she has an available appointment. If she has any difficulty with scheduling etc. please let us know. Or if they need additional records she can let us know. Right now she does feel safe to be in her home environment and is not having any any active suicidal thoughts. Follow-up in one month.  Decline mammogram today but says she will consider later this year.  Time spent 25 minutes, greater than 50% of time spent discussing and counseling about bipolar disorder and medications.

## 2015-09-26 ENCOUNTER — Ambulatory Visit (INDEPENDENT_AMBULATORY_CARE_PROVIDER_SITE_OTHER): Payer: Commercial Managed Care - HMO | Admitting: Family Medicine

## 2015-09-26 ENCOUNTER — Encounter: Payer: Self-pay | Admitting: Family Medicine

## 2015-09-26 VITALS — BP 125/71 | HR 80 | Wt 185.0 lb

## 2015-09-26 DIAGNOSIS — F3130 Bipolar disorder, current episode depressed, mild or moderate severity, unspecified: Secondary | ICD-10-CM

## 2015-09-26 DIAGNOSIS — Z5181 Encounter for therapeutic drug level monitoring: Secondary | ICD-10-CM

## 2015-09-26 DIAGNOSIS — J069 Acute upper respiratory infection, unspecified: Secondary | ICD-10-CM | POA: Diagnosis not present

## 2015-09-26 MED ORDER — ARIPIPRAZOLE 15 MG PO TABS: 15.0000 mg | ORAL_TABLET | Freq: Every day | ORAL | Status: DC

## 2015-09-26 NOTE — Progress Notes (Signed)
   Subjective:    Patient ID: Erika Hancock, female    DOB: [birth year], 55 y.o.   MRN: [id number]  HPI URI x 3-4 days with nasal congestion, cough and post nasal drip.  She was too sick to go to her psych appt 2 days ago so she had to cancel it. She was wanting to get in at [facility] because several of her specialists are there but she was told that her primary care provider would have to be at [facility] as well. No fevers chills or sweats. She does have a history of asthma and is on Symbicort daily.  Follow-up bipolar 1 disorder - She has been more jittery and shakey still. She says Lithium made her really dizzy and off balance and really impaired her memory.  She came off of it on her own.  Says she felt really off the firs 3 days after she stopped the med.  She is using he klonopin sparingly. Says her sister told her she was acting really manic. She was talking really fast.  She was recently on Geodon. She does complain about feeling nervous and on edge frequently as well as some mild irritability. No thoughts of wanting to harm herself.  Recent suicide attempt.    GAD-7=10(somewhat difficult) PHQ-9=4(somewhat difficult)    Review of Systems     Objective:   Physical Exam  Constitutional: She is oriented to person, place, and time. She appears well-developed and well-nourished.  No pressured speech today.   HENT:  Head: Normocephalic and atraumatic.  Right Ear: External ear normal.  Left Ear: External ear normal.  Nose: Nose normal.  Mouth/Throat: Oropharynx is clear and moist.  TMs and canals are clear.   Eyes: Conjunctivae and EOM are normal. Pupils are equal, round, and reactive to light.  Neck: Neck supple. No thyromegaly present.  Cardiovascular: Normal rate, regular rhythm and normal heart sounds.   Pulmonary/Chest: Effort normal and breath sounds normal. She has no wheezes.  Lymphadenopathy:    She has no cervical adenopathy.  Neurological: She is alert and  oriented to person, place, and time.  Skin: Skin is warm and dry.  Psychiatric: She has a normal mood and affect.          Assessment & Plan:  URI - viral. Call if suddenly worse or if she feels like it's affecting her asthma or if she is just not better by the end of the week.  Bipolar 1 disorder- since she took herself off the lithium I would like to start Abilify. She's never tried that before but says her daughter was on at one point in time. We'll start with 15 mg daily. We'll get her scheduled with her psychiatrist here in our office. Encouraged her to go down today and get this scheduled and I'll place the referral in the system. Stressed the importance of getting a psychiatrist to manage her medications.   If she can't get in before 4 weeks then I will see her back in 4 weeks.

## 2015-10-09 ENCOUNTER — Ambulatory Visit (INDEPENDENT_AMBULATORY_CARE_PROVIDER_SITE_OTHER): Payer: Medicare HMO | Admitting: Psychiatry

## 2015-10-09 ENCOUNTER — Encounter (HOSPITAL_COMMUNITY): Payer: Self-pay | Admitting: Psychiatry

## 2015-10-09 VITALS — BP 128/72 | HR 100 | Ht 68.0 in | Wt 185.0 lb

## 2015-10-09 DIAGNOSIS — F0630 Mood disorder due to known physiological condition, unspecified: Secondary | ICD-10-CM

## 2015-10-09 DIAGNOSIS — F411 Generalized anxiety disorder: Secondary | ICD-10-CM

## 2015-10-09 DIAGNOSIS — G4700 Insomnia, unspecified: Secondary | ICD-10-CM | POA: Diagnosis not present

## 2015-10-09 DIAGNOSIS — F3130 Bipolar disorder, current episode depressed, mild or moderate severity, unspecified: Secondary | ICD-10-CM | POA: Diagnosis not present

## 2015-10-09 DIAGNOSIS — F339 Major depressive disorder, recurrent, unspecified: Secondary | ICD-10-CM | POA: Diagnosis not present

## 2015-10-09 MED ORDER — FLUOXETINE HCL 10 MG PO CAPS: 10.0000 mg | ORAL_CAPSULE | Freq: Every day | ORAL | Status: DC

## 2015-10-09 MED ORDER — LAMOTRIGINE 25 MG PO TABS: 25.0000 mg | ORAL_TABLET | Freq: Every day | ORAL | Status: DC
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
## 2015-10-24 ENCOUNTER — Other Ambulatory Visit: Payer: Self-pay | Admitting: Family Medicine

## 2015-11-06 ENCOUNTER — Ambulatory Visit (HOSPITAL_COMMUNITY): Payer: Self-pay | Admitting: Psychiatry

## 2015-11-08 DIAGNOSIS — R269 Unspecified abnormalities of gait and mobility: Secondary | ICD-10-CM | POA: Diagnosis not present

## 2015-11-08 DIAGNOSIS — H9193 Unspecified hearing loss, bilateral: Secondary | ICD-10-CM | POA: Diagnosis not present

## 2015-11-08 DIAGNOSIS — G35 Multiple sclerosis: Secondary | ICD-10-CM | POA: Diagnosis not present

## 2015-11-08 DIAGNOSIS — H469 Unspecified optic neuritis: Secondary | ICD-10-CM | POA: Diagnosis not present

## 2015-11-12 DIAGNOSIS — G35 Multiple sclerosis: Secondary | ICD-10-CM | POA: Diagnosis not present

## 2015-11-13 DIAGNOSIS — G35 Multiple sclerosis: Secondary | ICD-10-CM | POA: Diagnosis not present

## 2015-11-21 ENCOUNTER — Ambulatory Visit (HOSPITAL_COMMUNITY): Payer: Self-pay | Admitting: Psychiatry

## 2015-11-29 ENCOUNTER — Other Ambulatory Visit: Payer: Self-pay | Admitting: Family Medicine

## 2015-12-03 ENCOUNTER — Ambulatory Visit (HOSPITAL_COMMUNITY): Payer: Self-pay | Admitting: Psychiatry

## 2015-12-06 DIAGNOSIS — G35 Multiple sclerosis: Secondary | ICD-10-CM | POA: Diagnosis not present

## 2015-12-06 DIAGNOSIS — H469 Unspecified optic neuritis: Secondary | ICD-10-CM | POA: Diagnosis not present

## 2015-12-06 DIAGNOSIS — N319 Neuromuscular dysfunction of bladder, unspecified: Secondary | ICD-10-CM | POA: Diagnosis not present

## 2015-12-06 DIAGNOSIS — R269 Unspecified abnormalities of gait and mobility: Secondary | ICD-10-CM | POA: Diagnosis not present

## 2015-12-28 ENCOUNTER — Encounter (HOSPITAL_COMMUNITY): Payer: Self-pay | Admitting: *Deleted

## 2015-12-28 ENCOUNTER — Ambulatory Visit (INDEPENDENT_AMBULATORY_CARE_PROVIDER_SITE_OTHER): Payer: Commercial Managed Care - HMO | Admitting: Psychiatry

## 2015-12-28 ENCOUNTER — Encounter (HOSPITAL_COMMUNITY): Payer: Self-pay | Admitting: Psychiatry

## 2015-12-28 DIAGNOSIS — F411 Generalized anxiety disorder: Secondary | ICD-10-CM

## 2015-12-28 DIAGNOSIS — G4700 Insomnia, unspecified: Secondary | ICD-10-CM | POA: Diagnosis not present

## 2015-12-28 DIAGNOSIS — F0630 Mood disorder due to known physiological condition, unspecified: Secondary | ICD-10-CM | POA: Diagnosis not present

## 2015-12-28 DIAGNOSIS — F3130 Bipolar disorder, current episode depressed, mild or moderate severity, unspecified: Secondary | ICD-10-CM
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
## 2015-12-30 ENCOUNTER — Other Ambulatory Visit: Payer: Self-pay | Admitting: Family Medicine

## 2016-01-05 ENCOUNTER — Other Ambulatory Visit: Payer: Self-pay | Admitting: Family Medicine

## 2016-01-08 ENCOUNTER — Other Ambulatory Visit: Payer: Self-pay | Admitting: Family Medicine

## 2016-01-10 ENCOUNTER — Other Ambulatory Visit (HOSPITAL_COMMUNITY): Payer: Self-pay | Admitting: Psychiatry

## 2016-01-10 NOTE — Telephone Encounter (Signed)
Received medication request from [facility] for Klonopin 0.5mg . Per Dr. Gilmore Laroche, medication request is denied. Pt was discharged from practice on 12/28/15 per pt's request.

## 2016-01-29 DIAGNOSIS — G35 Multiple sclerosis: Secondary | ICD-10-CM | POA: Diagnosis not present

## 2016-01-30 ENCOUNTER — Other Ambulatory Visit: Payer: Self-pay | Admitting: Family Medicine

## 2016-02-08 ENCOUNTER — Other Ambulatory Visit: Payer: Self-pay

## 2016-02-08 ENCOUNTER — Ambulatory Visit (INDEPENDENT_AMBULATORY_CARE_PROVIDER_SITE_OTHER): Payer: Commercial Managed Care - HMO | Admitting: Physician Assistant

## 2016-02-08 ENCOUNTER — Encounter: Payer: Self-pay | Admitting: Physician Assistant

## 2016-02-08 ENCOUNTER — Ambulatory Visit (INDEPENDENT_AMBULATORY_CARE_PROVIDER_SITE_OTHER): Payer: Commercial Managed Care - HMO

## 2016-02-08 VITALS — BP 139/72 | HR 90 | Ht 68.0 in | Wt 194.0 lb

## 2016-02-08 DIAGNOSIS — E538 Deficiency of other specified B group vitamins: Secondary | ICD-10-CM | POA: Diagnosis not present

## 2016-02-08 DIAGNOSIS — G35 Multiple sclerosis: Secondary | ICD-10-CM | POA: Diagnosis not present

## 2016-02-08 DIAGNOSIS — R600 Localized edema: Secondary | ICD-10-CM | POA: Diagnosis not present

## 2016-02-08 DIAGNOSIS — R39198 Other difficulties with micturition: Secondary | ICD-10-CM | POA: Diagnosis not present

## 2016-02-08 DIAGNOSIS — F17210 Nicotine dependence, cigarettes, uncomplicated: Secondary | ICD-10-CM

## 2016-02-08 DIAGNOSIS — J45909 Unspecified asthma, uncomplicated: Secondary | ICD-10-CM | POA: Diagnosis not present

## 2016-02-08 DIAGNOSIS — R05 Cough: Secondary | ICD-10-CM | POA: Diagnosis not present

## 2016-02-08 DIAGNOSIS — R5383 Other fatigue: Secondary | ICD-10-CM

## 2016-02-08 DIAGNOSIS — R790 Abnormal level of blood mineral: Secondary | ICD-10-CM | POA: Diagnosis not present

## 2016-02-08 DIAGNOSIS — J4521 Mild intermittent asthma with (acute) exacerbation: Secondary | ICD-10-CM

## 2016-02-08 DIAGNOSIS — R0602 Shortness of breath: Secondary | ICD-10-CM | POA: Diagnosis not present

## 2016-02-08 DIAGNOSIS — E559 Vitamin D deficiency, unspecified: Secondary | ICD-10-CM | POA: Diagnosis not present

## 2016-02-08 LAB — FERRITIN: FERRITIN: 39 ng/mL (ref 10–232)

## 2016-02-08 LAB — CBC WITH DIFFERENTIAL/PLATELET
BASOS ABS: 0 {cells}/uL (ref 0–200)
Basophils Relative: 0 %
EOS PCT: 4 %
Eosinophils Absolute: 368 cells/uL (ref 15–500)
HCT: 41.4 % (ref 35.0–45.0)
HEMOGLOBIN: 13.7 g/dL (ref 11.7–15.5)
LYMPHS PCT: 28 %
Lymphs Abs: 2576 cells/uL (ref 850–3900)
MCH: 31.5 pg (ref 27.0–33.0)
MCHC: 33.1 g/dL (ref 32.0–36.0)
MCV: 95.2 fL (ref 80.0–100.0)
MONOS PCT: 7 %
MPV: 9.1 fL (ref 7.5–12.5)
Monocytes Absolute: 644 cells/uL (ref 200–950)
NEUTROS ABS: 5612 {cells}/uL (ref 1500–7800)
NEUTROS PCT: 61 %
Platelets: 273 10*3/uL (ref 140–400)
RBC: 4.35 MIL/uL (ref 3.80–5.10)
RDW: 14.1 % (ref 11.0–15.0)
WBC: 9.2 10*3/uL (ref 3.8–10.8)

## 2016-02-08 LAB — POCT URINALYSIS DIPSTICK
Bilirubin, UA: NEGATIVE
GLUCOSE UA: NEGATIVE
Ketones, UA: NEGATIVE
Leukocytes, UA: NEGATIVE
Nitrite, UA: NEGATIVE
Protein, UA: NEGATIVE
RBC UA: NEGATIVE
SPEC GRAV UA: 1.01
UROBILINOGEN UA: 0.2
pH, UA: 6.5

## 2016-02-08 LAB — COMPLETE METABOLIC PANEL WITH GFR
ALBUMIN: 4.1 g/dL (ref 3.6–5.1)
ALK PHOS: 96 U/L (ref 33–130)
ALT: 16 U/L (ref 6–29)
AST: 18 U/L (ref 10–35)
BILIRUBIN TOTAL: 0.4 mg/dL (ref 0.2–1.2)
BUN: 14 mg/dL (ref 7–25)
CALCIUM: 8.9 mg/dL (ref 8.6–10.4)
CHLORIDE: 107 mmol/L (ref 98–110)
CO2: 21 mmol/L (ref 20–31)
CREATININE: 0.78 mg/dL (ref 0.50–1.05)
GFR, Est Non African American: 86 mL/min (ref >=60)
Glucose, Bld: 98 mg/dL (ref 65–99)
Potassium: 4.7 mmol/L (ref 3.5–5.3)
Sodium: 140 mmol/L (ref 135–146)
Total Protein: 6.6 g/dL (ref 6.1–8.1)

## 2016-02-08 LAB — TSH: TSH: 1.88 m[IU]/L

## 2016-02-08 LAB — VITAMIN B12: VITAMIN B 12: 1017 pg/mL (ref 200–1100)

## 2016-02-08 MED ORDER — PREDNISONE 20 MG PO TABS: ORAL_TABLET | ORAL | Status: DC

## 2016-02-08 MED ORDER — FUROSEMIDE 10 MG/ML IJ SOLN
40.0000 mg | Freq: Once | INTRAMUSCULAR | Status: AC
  Administered 2016-02-08: 40 mg via INTRAMUSCULAR

## 2016-02-08 MED ORDER — FUROSEMIDE 40 MG PO TABS: 40.0000 mg | ORAL_TABLET | Freq: Two times a day (BID) | ORAL | Status: DC

## 2016-02-09 LAB — BRAIN NATRIURETIC PEPTIDE: BRAIN NATRIURETIC PEPTIDE: 43.6 pg/mL (ref <100)

## 2016-02-09 LAB — VITAMIN D 25 HYDROXY (VIT D DEFICIENCY, FRACTURES): Vit D, 25-Hydroxy: 42 ng/mL (ref 30–100)

## 2016-02-11 NOTE — Progress Notes (Signed)
   Subjective:    Patient ID: Erika Hancock, female    DOB: Jan 07, 1961, 55 y.o.   MRN: [id number]  HPI Pt is a 55 yo female who presents to the clinic with chief compliant of bilateral lower extremity edema. She feels very "full". Denies any wheezing or cough. She does have SOB. She does complain of no energy. She feels completely drained. She is concerned because she has family hx of CHF. She is taking lasix 20mg  twice a day but does not feel like is is urinating any more than usual. No fever, chills, n/v/d.    Review of Systems  All other systems reviewed and are negative.      Objective:   Physical Exam  Constitutional: She is oriented to person, place, and time. She appears well-developed and well-nourished.  HENT:  Head: Normocephalic and atraumatic.  Neck: Normal range of motion. Neck supple. No thyromegaly present.  Cardiovascular: Normal rate, regular rhythm and normal heart sounds.   Pulmonary/Chest: Effort normal and breath sounds normal. She has no wheezes.  Neurological: She is alert and oriented to person, place, and time.  Skin:  2+pitting edema bilateral up to mid lower leg.  Legs generally erythematous.   Psychiatric: She has a normal mood and affect. Her behavior is normal.          Assessment & Plan:  Bilateral lower extremity edema/SOB- no physical exam signs consistent with CHF. Will order BNP and CXR. cmp to look at kidney function.   Lasix 40mg  IM given in office today. Tomorrow start 40mg  bid for 3-5 days then cut down to once a day.  Discussed compression stockings and elevation.  Low salt diet education.   Follow up as needed.   SOB/asthma exacerbation-bmp and CXR ordered.   Mild hyper inflation consistent with known reactive airway disease. There is no pneumonia nor other active cardiopulmonary disease.  Prednisone taper given. Use as needed albuterol for rescue.     No energy- b12, vitamin d, cbc, ferritin, TSH ordered. Discussed with  SOB could be some worsening asthma.

## 2016-02-24 ENCOUNTER — Other Ambulatory Visit: Payer: Self-pay | Admitting: Family Medicine

## 2016-03-07 DIAGNOSIS — G35 Multiple sclerosis: Secondary | ICD-10-CM | POA: Diagnosis not present

## 2016-03-07 DIAGNOSIS — H538 Other visual disturbances: Secondary | ICD-10-CM | POA: Diagnosis not present

## 2016-03-25 ENCOUNTER — Other Ambulatory Visit: Payer: Self-pay | Admitting: Family Medicine

## 2016-04-01 DIAGNOSIS — G35 Multiple sclerosis: Secondary | ICD-10-CM | POA: Diagnosis not present

## 2016-04-01 DIAGNOSIS — F319 Bipolar disorder, unspecified: Secondary | ICD-10-CM | POA: Diagnosis not present

## 2016-04-01 DIAGNOSIS — R55 Syncope and collapse: Secondary | ICD-10-CM | POA: Diagnosis not present

## 2016-04-01 DIAGNOSIS — R4182 Altered mental status, unspecified: Secondary | ICD-10-CM | POA: Diagnosis not present

## 2016-04-01 DIAGNOSIS — R5383 Other fatigue: Secondary | ICD-10-CM | POA: Diagnosis not present

## 2016-04-01 DIAGNOSIS — G40909 Epilepsy, unspecified, not intractable, without status epilepticus: Secondary | ICD-10-CM | POA: Diagnosis not present

## 2016-04-01 DIAGNOSIS — F17200 Nicotine dependence, unspecified, uncomplicated: Secondary | ICD-10-CM | POA: Diagnosis not present

## 2016-04-01 DIAGNOSIS — R569 Unspecified convulsions: Secondary | ICD-10-CM | POA: Diagnosis not present

## 2016-04-02 DIAGNOSIS — R55 Syncope and collapse: Secondary | ICD-10-CM | POA: Diagnosis not present

## 2016-04-21 ENCOUNTER — Other Ambulatory Visit: Payer: Self-pay | Admitting: Family Medicine

## 2016-06-06 DIAGNOSIS — M542 Cervicalgia: Secondary | ICD-10-CM | POA: Diagnosis not present

## 2016-06-06 DIAGNOSIS — R413 Other amnesia: Secondary | ICD-10-CM | POA: Diagnosis not present

## 2016-06-06 DIAGNOSIS — R42 Dizziness and giddiness: Secondary | ICD-10-CM | POA: Diagnosis not present

## 2016-06-06 DIAGNOSIS — G35 Multiple sclerosis: Secondary | ICD-10-CM | POA: Diagnosis not present

## 2016-06-06 DIAGNOSIS — R269 Unspecified abnormalities of gait and mobility: Secondary | ICD-10-CM | POA: Diagnosis not present

## 2016-06-09 ENCOUNTER — Other Ambulatory Visit: Payer: Self-pay | Admitting: Family Medicine

## 2016-06-09 DIAGNOSIS — G35 Multiple sclerosis: Secondary | ICD-10-CM

## 2016-06-16 ENCOUNTER — Ambulatory Visit: Payer: Self-pay | Admitting: Family Medicine

## 2016-07-18 DIAGNOSIS — Z888 Allergy status to other drugs, medicaments and biological substances status: Secondary | ICD-10-CM | POA: Diagnosis not present

## 2016-07-18 DIAGNOSIS — F17210 Nicotine dependence, cigarettes, uncomplicated: Secondary | ICD-10-CM | POA: Diagnosis not present

## 2016-07-18 DIAGNOSIS — Z7951 Long term (current) use of inhaled steroids: Secondary | ICD-10-CM | POA: Diagnosis not present

## 2016-07-18 DIAGNOSIS — G35 Multiple sclerosis: Secondary | ICD-10-CM | POA: Diagnosis not present

## 2016-07-18 DIAGNOSIS — J069 Acute upper respiratory infection, unspecified: Secondary | ICD-10-CM | POA: Diagnosis not present

## 2016-07-18 DIAGNOSIS — J45901 Unspecified asthma with (acute) exacerbation: Secondary | ICD-10-CM | POA: Diagnosis not present

## 2016-07-18 DIAGNOSIS — Z91018 Allergy to other foods: Secondary | ICD-10-CM | POA: Diagnosis not present

## 2016-07-18 DIAGNOSIS — Z7982 Long term (current) use of aspirin: Secondary | ICD-10-CM | POA: Diagnosis not present

## 2016-07-18 DIAGNOSIS — I10 Essential (primary) hypertension: Secondary | ICD-10-CM | POA: Diagnosis not present

## 2016-07-18 DIAGNOSIS — R05 Cough: Secondary | ICD-10-CM | POA: Diagnosis not present

## 2016-07-21 ENCOUNTER — Ambulatory Visit (INDEPENDENT_AMBULATORY_CARE_PROVIDER_SITE_OTHER): Payer: Commercial Managed Care - HMO | Admitting: Family Medicine

## 2016-07-21 ENCOUNTER — Encounter: Payer: Self-pay | Admitting: Family Medicine

## 2016-07-21 VITALS — BP 145/76 | HR 97 | Temp 98.3°F | Ht 68.0 in | Wt 194.0 lb

## 2016-07-21 DIAGNOSIS — J0100 Acute maxillary sinusitis, unspecified: Secondary | ICD-10-CM

## 2016-07-21 MED ORDER — AMOXICILLIN-POT CLAVULANATE 875-125 MG PO TABS: 1.0000 | ORAL_TABLET | Freq: Two times a day (BID) | ORAL | 0 refills | Status: DC

## 2016-07-21 NOTE — Progress Notes (Signed)
   Subjective:    Patient ID: Erika Hancock, female    DOB: 1961/06/26, 55 y.o.   MRN: [id number]  HPI 1 week of URI sxs.  Cough, nasal congestion, fever and blood nasal drainage. Has been using Naproxen.  [facility] went to the ED on 12/1, but not an antibiotic.  She has had some a sharp pain bilaterally over the maxillary cheeks. She said earlier in the week they actually look swollen areas she feels like she's got some swollen lymph nodes in her neck. She says her ankles have been swelling as well.    Review of Systems     Objective:   Physical Exam  Constitutional: She is oriented to person, place, and time. She appears well-developed and well-nourished.  HENT:  Head: Normocephalic and atraumatic.  Right Ear: External ear normal.  Left Ear: External ear normal.  Nose: Nose normal.  Mouth/Throat: Oropharynx is clear and moist.  TMs and canals are clear.   Eyes: Conjunctivae and EOM are normal. Pupils are equal, round, and reactive to light.  Neck: Neck supple. No thyromegaly present.  Cardiovascular: Normal rate, regular rhythm and normal heart sounds.   Pulmonary/Chest: Effort normal and breath sounds normal. She has no wheezes.  Lymphadenopathy:    She has no cervical adenopathy.  Neurological: She is alert and oriented to person, place, and time.  Skin: Skin is warm and dry.  Psychiatric: She has a normal mood and affect.       Assessment & Plan:  Acute sinusitis - She's been sick for over a week at this point and she's actually felt worse in the last 2 days. We'll go ahead and treat her with Augmentin. Make sure hydrating well and get some rest. If not improving by the end of the week and please give the call back.

## 2016-07-23 ENCOUNTER — Telehealth: Payer: Self-pay

## 2016-07-23 MED ORDER — HYDROCODONE-HOMATROPINE 5-1.5 MG/5ML PO SYRP: 5.0000 mL | ORAL_SOLUTION | Freq: Every evening | ORAL | 0 refills | Status: DC | PRN

## 2016-07-23 NOTE — Telephone Encounter (Signed)
Pt reports that she has been coughing all night.  She has tried OTC cough meds. She is asking for Rx strength cough syrup. Please advise.

## 2016-07-23 NOTE — Telephone Encounter (Signed)
Pt.notified

## 2016-07-23 NOTE — Telephone Encounter (Signed)
Printed prescription for cough medication. She will have to come to pick it up.  Nani Gasser, MD

## 2016-08-09 DIAGNOSIS — R0600 Dyspnea, unspecified: Secondary | ICD-10-CM | POA: Diagnosis not present

## 2016-08-09 DIAGNOSIS — R0602 Shortness of breath: Secondary | ICD-10-CM | POA: Diagnosis not present

## 2016-08-09 DIAGNOSIS — F17200 Nicotine dependence, unspecified, uncomplicated: Secondary | ICD-10-CM | POA: Diagnosis not present

## 2016-08-09 DIAGNOSIS — J189 Pneumonia, unspecified organism: Secondary | ICD-10-CM | POA: Diagnosis not present

## 2016-08-09 DIAGNOSIS — G35 Multiple sclerosis: Secondary | ICD-10-CM | POA: Diagnosis not present

## 2016-08-09 DIAGNOSIS — Z8659 Personal history of other mental and behavioral disorders: Secondary | ICD-10-CM | POA: Diagnosis not present

## 2016-08-09 DIAGNOSIS — Z9049 Acquired absence of other specified parts of digestive tract: Secondary | ICD-10-CM | POA: Diagnosis not present

## 2016-09-17 ENCOUNTER — Other Ambulatory Visit: Payer: Self-pay | Admitting: Physician Assistant

## 2016-09-18 NOTE — Telephone Encounter (Signed)
She should really only be on it once a day. When [facility] originally Rx it would only supposed to be BID for about 5 days. The needs to cut down to once a day. Ok to refill for once a day, #90 but lets call her so she makes sure taking it correctly.

## 2016-09-18 NOTE — Telephone Encounter (Signed)
Prescribed by Lesly Rubenstein. Do you want to continue this medication?

## 2016-09-22 ENCOUNTER — Other Ambulatory Visit: Payer: Self-pay

## 2016-09-22 MED ORDER — FUROSEMIDE 40 MG PO TABS: 40.0000 mg | ORAL_TABLET | Freq: Every day | ORAL | 0 refills | Status: DC

## 2016-09-22 NOTE — Telephone Encounter (Signed)
Rx sent to Pharmacy. Pt was taking it 2 x daily. Pt informed to take it once daily per Dr. Linford Arnold.

## 2016-09-28 DIAGNOSIS — R079 Chest pain, unspecified: Secondary | ICD-10-CM | POA: Diagnosis not present

## 2016-09-29 ENCOUNTER — Ambulatory Visit (INDEPENDENT_AMBULATORY_CARE_PROVIDER_SITE_OTHER): Payer: Medicare HMO | Admitting: Family Medicine

## 2016-09-29 ENCOUNTER — Ambulatory Visit (INDEPENDENT_AMBULATORY_CARE_PROVIDER_SITE_OTHER): Payer: Medicare HMO

## 2016-09-29 ENCOUNTER — Encounter: Payer: Self-pay | Admitting: Family Medicine

## 2016-09-29 VITALS — BP 122/71 | HR 93 | Ht 68.0 in | Wt 198.0 lb

## 2016-09-29 DIAGNOSIS — J441 Chronic obstructive pulmonary disease with (acute) exacerbation: Secondary | ICD-10-CM

## 2016-09-29 DIAGNOSIS — R0789 Other chest pain: Secondary | ICD-10-CM | POA: Diagnosis not present

## 2016-09-29 DIAGNOSIS — R9431 Abnormal electrocardiogram [ECG] [EKG]: Secondary | ICD-10-CM

## 2016-09-29 DIAGNOSIS — R0602 Shortness of breath: Secondary | ICD-10-CM

## 2016-09-29 DIAGNOSIS — R1011 Right upper quadrant pain: Secondary | ICD-10-CM | POA: Diagnosis not present

## 2016-09-29 NOTE — Progress Notes (Signed)
Subjective:    Patient ID: Erika Hancock, female    DOB: May 21, 1961, 56 y.o.   MRN: [id number]  HPI 56 year old female with MS comes in today complaining of a pain under her right rib pain. Called EMS last night and given nitro and they did an EKG and told it was normal. Has had some tenderness in the RUQ but that is not new.  She went to ED abut the wait time was so long.  She has not had chest pain since then but she has had it previously.  She has had a rash/hives for a months.   She says she gets these episodes where her palms start itching and then her lips started swelling and she breaks out in hives from head to toe. She said she had one episode in November 2 episodes in December 3 episodes in January and has Artie had 4 episodes so far in February and were only halfway through the month. The last one was about 2 days ago. She has not had any tongue swelling or shortness of breath with it. He denies any new soaps, lotions, perfumes, detergents, medications or medication changes. She denies any major dietary changes.  She has had persistent cough and intermittent wheezing since when she was sick in early December where she was diagnosed with probable multifocal pneumonia.  Review of Systems     BP 122/71   Pulse 93   Ht 5\' 8"  (1.727 m)   Wt 198 lb (89.8 kg)   SpO2 96%   BMI 30.11 kg/m     Allergies  Allergen Reactions  . Lithium Other (See Comments)    Headache,dizzy    Past Medical History:  Diagnosis Date  . Anxiety   . Asthma   . Depression   . MS (multiple sclerosis) (HCC) 1995   relapsing  . Perimenopausal     Past Surgical History:  Procedure Laterality Date  . BIOPSY BREAST  1999, 2000   benign    Social History   Social History  . Marital status: Widowed    Spouse name: N/A  . Number of children: N/A  . Years of education: N/A   Occupational History  . Not on file.   Social History Main Topics  . Smoking status: Current Every Day Smoker     Packs/day: 1.00    Types: Cigarettes    Last attempt to quit: 08/18/2008  . Smokeless tobacco: Never Used  . Alcohol use Yes     Comment: infrequent  . Drug use: Unknown  . 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   Other Topics Concern  . Not on file   Social History Narrative  . No narrative on file    Family History  Problem Relation Age of Onset  . Heart disease Mother     died CHF  . Heart disease Father 44    heart attack  . Hypertension Father   . COPD Sister   . Glaucoma Sister   . Hypertension Sister   . Multiple sclerosis      cousin  . Breast cancer Maternal Aunt   . Breast cancer Maternal Grandmother 42  . Bipolar disorder Neg Hx     Outpatient Encounter Prescriptions as of 09/29/2016  Medication Sig  . albuterol (ACCUNEB) 1.25 MG/3ML nebulizer solution Take 1 ampule by nebulization every 6 (six) hours as needed.    [name] albuterol (  VENTOLIN HFA) 108 (90 Base) MCG/ACT inhaler Inhale 2 puffs into the lungs every 6 (six) hours as needed for wheezing or shortness of breath.  . ALPRAZolam (XANAX) 1 MG tablet   . Ascorbic Acid (VITAMIN C PO) Take by mouth.  . clonazePAM (KLONOPIN) 0.5 MG tablet take 1 tablet by mouth twice a day if needed for anxiety  . cyanocobalamin (,VITAMIN B-12,) 1000 MCG/ML injection   . FLUoxetine (PROZAC) 40 MG capsule Take 40 mg by mouth daily.  . fluticasone (FLONASE) 50 MCG/ACT nasal spray Place 2 sprays into the nose daily.    [name] FOLIC ACID PO Take by mouth.  . furosemide (LASIX) 40 MG tablet Take 1 tablet (40 mg total) by mouth daily.  [name] glatiramer (COPAXONE) 20 MG/ML SOSY injection Inject 20 mg into the skin daily.  [name] oxybutynin (DITROPAN-XL) 5 MG 24 hr tablet   . oxyCODONE-acetaminophen (PERCOCET) 7.5-325 MG tablet Take 1 tablet by mouth 3 (three) times daily.  . potassium chloride (K-DUR,KLOR-CON) 10 MEQ tablet   . SYMBICORT 160-4.5 MCG/ACT inhaler inhale 2 puffs INTO THE  LUNGS twice a day  . topiramate (TOPAMAX) 200 MG tablet take 1/2 tablet by mouth twice a day  . Vitamin D, Ergocalciferol, (DRISDOL) 50000 units CAPS capsule Take 50,000 Units by mouth once a week.  . [DISCONTINUED] amoxicillin-clavulanate (AUGMENTIN) 875-125 MG tablet Take 1 tablet by mouth 2 (two) times daily.  . [DISCONTINUED] HYDROcodone-homatropine (HYCODAN) 5-1.5 MG/5ML syrup Take 5 mLs by mouth at bedtime as needed for cough.   No facility-administered encounter medications on file as of 09/29/2016.       Objective:   Physical Exam  Constitutional: She is oriented to person, place, and time. She appears well-developed and well-nourished.  HENT:  Head: Normocephalic and atraumatic.  Right Ear: External ear normal.  Left Ear: External ear normal.  Nose: Nose normal.  Mouth/Throat: Oropharynx is clear and moist.  TMs and canals are clear.   Eyes: Conjunctivae and EOM are normal. Pupils are equal, round, and reactive to light.  Neck: Neck supple. No thyromegaly present.  Cardiovascular: Normal rate, regular rhythm and normal heart sounds.   Pulmonary/Chest: Effort normal and breath sounds normal. She has no wheezes.  Abdominal: Soft. Bowel sounds are normal. She exhibits no distension and no mass. There is tenderness. There is no rebound and no guarding.  + TTP in the RUQ just below the ribs and over the right lower ribs as well.   Lymphadenopathy:    She has no cervical adenopathy.  Neurological: She is alert and oriented to person, place, and time.  Skin: Skin is warm and dry. No rash noted.  Psychiatric: She has a normal mood and affect. Her behavior is normal.        Assessment & Plan:  Right upper quadrant pain-we'll test for liver enzymes as well as acute hepatitis and do a CBC.  COPD-she's had persistent cough shortness of breath and wheezing and she does have wheezing on exam today. I would like to repeat chest x-ray today for further evaluation. Next  Atypical  chest pain-we'll check CBC, troponin and CK. Will call with results once available. Repeat EKG done today. EKG today shows rate of 80 bpm, normal sinus rhythm. No acute ST-T wave elevation. She does have prolonged QT with corrected of 502 ms.  Hives - unclear etiology. Doesn't sound like there is any specific triggers none of her medications have changed. Will check a CBC with differential and see if her eosinophils  are elevated  QT prolongation-Not sure if new or not.  Fluoxetine can cause prolongation. May have her drop back on her dose to half a tab then have her come back for repeat EKG about 2 weeks.

## 2016-09-30 ENCOUNTER — Other Ambulatory Visit: Payer: Self-pay | Admitting: Emergency Medicine

## 2016-09-30 ENCOUNTER — Telehealth: Payer: Self-pay | Admitting: Family Medicine

## 2016-09-30 ENCOUNTER — Other Ambulatory Visit: Payer: Self-pay | Admitting: Family Medicine

## 2016-09-30 LAB — CBC WITH DIFFERENTIAL/PLATELET
BASOS ABS: 82 {cells}/uL (ref 0–200)
BASOS PCT: 1 %
EOS ABS: 246 {cells}/uL (ref 15–500)
EOS PCT: 3 %
HCT: 39.7 % (ref 35.0–45.0)
HEMOGLOBIN: 13.2 g/dL (ref 11.7–15.5)
LYMPHS ABS: 2624 {cells}/uL (ref 850–3900)
Lymphocytes Relative: 32 %
MCH: 31.7 pg (ref 27.0–33.0)
MCHC: 33.2 g/dL (ref 32.0–36.0)
MCV: 95.4 fL (ref 80.0–100.0)
MPV: 8.9 fL (ref 7.5–12.5)
Monocytes Absolute: 738 cells/uL (ref 200–950)
Monocytes Relative: 9 %
NEUTROS ABS: 4510 {cells}/uL (ref 1500–7800)
Neutrophils Relative %: 55 %
Platelets: 288 10*3/uL (ref 140–400)
RBC: 4.16 MIL/uL (ref 3.80–5.10)
RDW: 13.7 % (ref 11.0–15.0)
WBC: 8.2 10*3/uL (ref 3.8–10.8)

## 2016-09-30 LAB — COMPLETE METABOLIC PANEL WITH GFR
ALT: 113 U/L — ABNORMAL HIGH (ref 6–29)
AST: 109 U/L — AB (ref 10–35)
Albumin: 4.2 g/dL (ref 3.6–5.1)
Alkaline Phosphatase: 138 U/L — ABNORMAL HIGH (ref 33–130)
BUN: 12 mg/dL (ref 7–25)
CO2: 28 mmol/L (ref 20–31)
Calcium: 8.5 mg/dL — ABNORMAL LOW (ref 8.6–10.4)
Chloride: 102 mmol/L (ref 98–110)
Creat: 0.85 mg/dL (ref 0.50–1.05)
GFR, Est African American: 89 mL/min (ref >=60)
GFR, Est Non African American: 77 mL/min (ref >=60)
Glucose, Bld: 95 mg/dL (ref 65–99)
POTASSIUM: 3.5 mmol/L (ref 3.5–5.3)
SODIUM: 140 mmol/L (ref 135–146)
Total Bilirubin: 0.6 mg/dL (ref 0.2–1.2)
Total Protein: 6.6 g/dL (ref 6.1–8.1)

## 2016-09-30 LAB — HIV ANTIBODY (ROUTINE TESTING W REFLEX): HIV: NONREACTIVE

## 2016-09-30 LAB — CK TOTAL AND CKMB (NOT AT ARMC)
CK, MB: 1.6 ng/mL (ref 0.0–5.0)
Relative Index: 1.2 (ref 0.0–4.0)
Total CK: 133 U/L (ref 7–177)

## 2016-09-30 LAB — HEPATITIS PANEL, ACUTE
HCV AB: NEGATIVE
HEP A IGM: NONREACTIVE
HEP B C IGM: NONREACTIVE
Hepatitis B Surface Ag: NEGATIVE

## 2016-09-30 LAB — TROPONIN I: Troponin I: <0.01 ng/mL (ref <=0.05)

## 2016-09-30 LAB — TSH: TSH: 0.92 m[IU]/L

## 2016-09-30 MED ORDER — PREDNISONE 20 MG PO TABS: 40.0000 mg | ORAL_TABLET | Freq: Every day | ORAL | 0 refills | Status: DC

## 2016-09-30 NOTE — Telephone Encounter (Signed)
Call pt: EKG showed QT prolongation-Not sure if new or not.  Fluoxetine can cause prolongation. May have her drop back on her dose to half a tab then have her come back for repeat EKG about 2 weeks.

## 2016-09-30 NOTE — Progress Notes (Signed)
Spoke with patient: told her chest x-ray normal; want her to take  prednisone for about 10 days and  want her to use her albuterol inhaler 3 times a day for the next 3 or 4 days to really try to get her open and get her wheezing improved. Patient wanted prednisone sent to her new primary pharmacy/[facility] and also needed refill sent there for albuterol/ventolin inhaler. This was done.

## 2016-09-30 NOTE — Telephone Encounter (Signed)
Left detailed vm informing pt of recommendations. Advised her to cut fluoxetine in half, if she has any questions she should call back. Otherwise she will need to call and make an appt to have EKG repeated.Loralee Pacas Lastrup

## 2016-09-30 NOTE — Progress Notes (Unsigned)
Spoke with patient; gave her normal chest x-ray results; per Dr. Eppie Gibson told her "

## 2016-09-30 NOTE — Progress Notes (Signed)
pred 

## 2016-10-01 ENCOUNTER — Other Ambulatory Visit: Payer: Self-pay

## 2016-10-01 DIAGNOSIS — R748 Abnormal levels of other serum enzymes: Secondary | ICD-10-CM

## 2016-10-01 MED ORDER — FLUOXETINE HCL 20 MG PO TABS: 20.0000 mg | ORAL_TABLET | Freq: Every day | ORAL | 3 refills | Status: DC

## 2016-10-05 DIAGNOSIS — F10120 Alcohol abuse with intoxication, uncomplicated: Secondary | ICD-10-CM | POA: Diagnosis not present

## 2016-10-05 DIAGNOSIS — Y909 Presence of alcohol in blood, level not specified: Secondary | ICD-10-CM | POA: Diagnosis not present

## 2016-10-05 DIAGNOSIS — F10129 Alcohol abuse with intoxication, unspecified: Secondary | ICD-10-CM | POA: Diagnosis not present

## 2016-10-05 DIAGNOSIS — F17210 Nicotine dependence, cigarettes, uncomplicated: Secondary | ICD-10-CM | POA: Diagnosis not present

## 2016-10-05 DIAGNOSIS — F319 Bipolar disorder, unspecified: Secondary | ICD-10-CM | POA: Diagnosis not present

## 2016-10-05 DIAGNOSIS — G35 Multiple sclerosis: Secondary | ICD-10-CM | POA: Diagnosis not present

## 2016-10-06 ENCOUNTER — Other Ambulatory Visit: Payer: Self-pay

## 2016-10-07 ENCOUNTER — Encounter: Payer: Self-pay | Admitting: Family Medicine

## 2016-10-07 ENCOUNTER — Ambulatory Visit (INDEPENDENT_AMBULATORY_CARE_PROVIDER_SITE_OTHER): Payer: Medicare HMO

## 2016-10-07 DIAGNOSIS — R748 Abnormal levels of other serum enzymes: Secondary | ICD-10-CM

## 2016-10-07 DIAGNOSIS — R945 Abnormal results of liver function studies: Secondary | ICD-10-CM | POA: Diagnosis not present

## 2016-10-07 DIAGNOSIS — K760 Fatty (change of) liver, not elsewhere classified: Secondary | ICD-10-CM | POA: Insufficient documentation

## 2016-10-13 ENCOUNTER — Ambulatory Visit: Payer: Self-pay | Admitting: Family Medicine

## 2016-10-21 ENCOUNTER — Ambulatory Visit: Payer: Self-pay | Admitting: Family Medicine

## 2016-11-05 DIAGNOSIS — G35 Multiple sclerosis: Secondary | ICD-10-CM | POA: Diagnosis not present

## 2016-11-05 DIAGNOSIS — R269 Unspecified abnormalities of gait and mobility: Secondary | ICD-10-CM | POA: Diagnosis not present

## 2016-11-05 DIAGNOSIS — E6609 Other obesity due to excess calories: Secondary | ICD-10-CM | POA: Diagnosis not present

## 2016-11-05 DIAGNOSIS — M542 Cervicalgia: Secondary | ICD-10-CM | POA: Diagnosis not present

## 2016-11-13 ENCOUNTER — Telehealth: Payer: Self-pay

## 2016-11-13 ENCOUNTER — Other Ambulatory Visit: Payer: Self-pay

## 2016-11-13 MED ORDER — FLUOXETINE HCL 20 MG PO TABS: 30.0000 mg | ORAL_TABLET | Freq: Every day | ORAL | 3 refills | Status: DC

## 2016-11-13 NOTE — Telephone Encounter (Signed)
Script sent and patient notified.  

## 2016-11-13 NOTE — Telephone Encounter (Signed)
OK to go up to 1.5 tabs ( 30mg ) and see if works better. Can send new rx to [facility] new dosing if needed.

## 2016-11-13 NOTE — Telephone Encounter (Signed)
Pt called and said that since decreasing her prozac to 20 mg QD, she has become more depressed, irritated and agitated.  Please advise.

## 2016-12-18 ENCOUNTER — Other Ambulatory Visit: Payer: Self-pay | Admitting: Family Medicine

## 2016-12-18 DIAGNOSIS — F3181 Bipolar II disorder: Secondary | ICD-10-CM | POA: Diagnosis not present

## 2016-12-25 ENCOUNTER — Encounter: Payer: Self-pay | Admitting: Family Medicine

## 2016-12-25 ENCOUNTER — Ambulatory Visit (INDEPENDENT_AMBULATORY_CARE_PROVIDER_SITE_OTHER): Payer: Medicare HMO | Admitting: Family Medicine

## 2016-12-25 VITALS — BP 131/73 | HR 94 | Temp 98.6°F | Ht 68.0 in | Wt 185.0 lb

## 2016-12-25 DIAGNOSIS — J019 Acute sinusitis, unspecified: Secondary | ICD-10-CM

## 2016-12-25 DIAGNOSIS — R42 Dizziness and giddiness: Secondary | ICD-10-CM | POA: Diagnosis not present

## 2016-12-25 DIAGNOSIS — J4551 Severe persistent asthma with (acute) exacerbation: Secondary | ICD-10-CM | POA: Diagnosis not present

## 2016-12-25 MED ORDER — IPRATROPIUM-ALBUTEROL 0.5-2.5 (3) MG/3ML IN SOLN
3.0000 mL | Freq: Once | RESPIRATORY_TRACT | Status: AC
  Administered 2016-12-25: 3 mL via RESPIRATORY_TRACT

## 2016-12-25 MED ORDER — METHYLPREDNISOLONE SODIUM SUCC 125 MG IJ SOLR
125.0000 mg | Freq: Once | INTRAMUSCULAR | Status: AC
  Administered 2016-12-25: 125 mg via INTRAMUSCULAR

## 2016-12-25 MED ORDER — METHYLPREDNISOLONE ACETATE 80 MG/ML IJ SUSP
80.0000 mg | Freq: Once | INTRAMUSCULAR | Status: AC
  Administered 2016-12-25: 80 mg via INTRAMUSCULAR

## 2016-12-25 MED ORDER — ALBUTEROL SULFATE 1.25 MG/3ML IN NEBU: 1.0000 | INHALATION_SOLUTION | Freq: Four times a day (QID) | RESPIRATORY_TRACT | 99 refills | Status: AC | PRN

## 2016-12-25 MED ORDER — AMOXICILLIN-POT CLAVULANATE 875-125 MG PO TABS: 1.0000 | ORAL_TABLET | Freq: Two times a day (BID) | ORAL | 0 refills | Status: AC

## 2016-12-25 MED ORDER — PROMETHAZINE-DM 6.25-15 MG/5ML PO SYRP: 5.0000 mL | ORAL_SOLUTION | Freq: Three times a day (TID) | ORAL | 0 refills | Status: AC | PRN

## 2016-12-25 MED ORDER — ALBUTEROL SULFATE (2.5 MG/3ML) 0.083% IN NEBU
2.5000 mg | INHALATION_SOLUTION | Freq: Once | RESPIRATORY_TRACT | Status: AC
  Administered 2016-12-25: 2.5 mg via RESPIRATORY_TRACT

## 2016-12-25 MED ORDER — BUDESONIDE-FORMOTEROL FUMARATE 160-4.5 MCG/ACT IN AERO
INHALATION_SPRAY | RESPIRATORY_TRACT | 3 refills | Status: DC
Start: 2016-12-25 — End: 2017-04-24

## 2016-12-25 NOTE — Progress Notes (Signed)
Subjective:    Patient ID: Erika Hancock, female    DOB: 10-25-1960, 56 y.o.   MRN: [id number]  HPI 56 year old female is in today complaining of one week of nasal congestion, sore throat and cough. She says the cough is more try that she gets some occasional sputum. She is getting a lot of discharge from her nose. She's felt short of breath. She has been using her niece's nebulizer machine but has run out of albuterol vials. He says she has been using her Symbicort regularly. No fevers chills or sweats. + nausea. She's worried about pneumonia. The last time she got sick she had a negative chest x-ray and then a day or 2 later ended up in the emergency department and admitted to the CT and she ended up actually having pneumonia.   Review of Systems   BP 131/73   Pulse 94   Temp 98.6 F (37 C)   Ht 5\' 8"  (1.727 m)   Wt 185 lb (83.9 kg)   LMP 10/31/2011   SpO2 98%   PF 250 L/min Comment: before nebulizer tx  BMI 28.13 kg/m     Allergies  Allergen Reactions  . Lithium Other (See Comments)    Headache,dizzy    Past Medical History:  Diagnosis Date  . Anxiety   . Asthma   . Depression   . MS (multiple sclerosis) (HCC) 1995   relapsing  . Perimenopausal     Past Surgical History:  Procedure Laterality Date  . BIOPSY BREAST  1999, 2000   benign    Social History   Social History  . Marital status: Widowed    Spouse name: N/A  . Number of children: N/A  . Years of education: N/A   Occupational History  . disabled    Social History Main Topics  . Smoking status: Current Every Day Smoker    Packs/day: 1.00    Types: Cigarettes    Last attempt to quit: 08/18/2008  . Smokeless tobacco: Never Used  . Alcohol use Yes     Comment: infrequent  . Drug use: Unknown  . 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   Other Topics Concern  . Not on file   Social History Narrative  . No narrative on file     Family History  Problem Relation Age of Onset  . Heart disease Mother        died CHF  . Heart disease Father 66       heart attack  . Hypertension Father   . COPD Sister   . Glaucoma Sister   . Hypertension Sister   . Multiple sclerosis Unknown        cousin  . Breast cancer Maternal Aunt   . Breast cancer Maternal Grandmother 16  . Bipolar disorder Neg Hx     Outpatient Encounter Prescriptions as of 12/25/2016  Medication Sig  . albuterol (ACCUNEB) 1.25 MG/3ML nebulizer solution Take 3 mLs (1.25 mg total) by nebulization every 6 (six) hours as needed.  [name] albuterol (VENTOLIN HFA) 108 (90 Base) MCG/ACT inhaler Inhale 2 puffs into the lungs every 6 (six) hours as needed for wheezing or shortness of breath.  . ALPRAZolam (XANAX) 1 MG tablet   . Ascorbic Acid (VITAMIN C PO) Take by mouth.  . budesonide-formoterol (SYMBICORT) 160-4.5 MCG/ACT inhaler inhale 2 puffs INTO THE LUNGS twice a day  . clonazePAM (KLONOPIN) 0.5  MG tablet take 1 tablet by mouth twice a day if needed for anxiety  . cyanocobalamin (,VITAMIN B-12,) 1000 MCG/ML injection   . FLUoxetine (PROZAC) 40 MG capsule Take 40 mg by mouth daily.  [name] FOLIC ACID PO Take by mouth.  . furosemide (LASIX) 40 MG tablet TAKE 1 TABLET (40 MG TOTAL) BY MOUTH DAILY.  [name] glatiramer (COPAXONE) 20 MG/ML SOSY injection Inject 20 mg into the skin daily.  [name] oxyCODONE-acetaminophen (PERCOCET) 10-325 MG tablet Take 1 tablet by mouth 3 (three) times daily as needed for pain.  . potassium chloride (K-DUR,KLOR-CON) 10 MEQ tablet   . topiramate (TOPAMAX) 200 MG tablet take 1/2 tablet by mouth twice a day  . Vitamin D, Ergocalciferol, (DRISDOL) 50000 units CAPS capsule Take 50,000 Units by mouth once a week.  . [DISCONTINUED] albuterol (ACCUNEB) 1.25 MG/3ML nebulizer solution Take 1 ampule by nebulization every 6 (six) hours as needed.    . [DISCONTINUED] SYMBICORT 160-4.5 MCG/ACT inhaler inhale 2 puffs INTO THE LUNGS twice a day  .  amoxicillin-clavulanate (AUGMENTIN) 875-125 MG tablet Take 1 tablet by mouth 2 (two) times daily.  . promethazine-dextromethorphan (PROMETHAZINE-DM) 6.25-15 MG/5ML syrup Take 5 mLs by mouth 3 (three) times daily as needed for cough.  . [DISCONTINUED] FLUoxetine (PROZAC) 20 MG tablet Take 1.5 tablets (30 mg total) by mouth daily.  . [DISCONTINUED] fluticasone (FLONASE) 50 MCG/ACT nasal spray Place 2 sprays into the nose daily.    . [DISCONTINUED] oxybutynin (DITROPAN-XL) 5 MG 24 hr tablet   . [DISCONTINUED] oxyCODONE-acetaminophen (PERCOCET) 7.5-325 MG tablet Take 1 tablet by mouth 3 (three) times daily.  . [DISCONTINUED] predniSONE (DELTASONE) 20 MG tablet Take 2 tablets (40 mg total) by mouth daily. After 5 days drop to 1 tab a day.  . [EXPIRED] albuterol (PROVENTIL) (2.5 MG/3ML) 0.083% nebulizer solution 2.5 mg   . [EXPIRED] ipratropium-albuterol (DUONEB) 0.5-2.5 (3) MG/3ML nebulizer solution 3 mL   . [EXPIRED] methylPREDNISolone acetate (DEPO-MEDROL) injection 80 mg   . [EXPIRED] methylPREDNISolone sodium succinate (SOLU-MEDROL) 125 mg/2 mL injection 125 mg    No facility-administered encounter medications on file as of 12/25/2016.           Objective:   Physical Exam  Constitutional: She is oriented to person, place, and time. She appears well-developed and well-nourished.  HENT:  Head: Normocephalic and atraumatic.  Right Ear: External ear normal.  Left Ear: External ear normal.  Nose: Nose normal.  Mouth/Throat: Oropharynx is clear and moist.  TMs and canals are clear.   Eyes: Conjunctivae and EOM are normal. Pupils are equal, round, and reactive to light.  Neck: Neck supple. No thyromegaly present.  Cardiovascular: Normal rate, regular rhythm and normal heart sounds.   Pulmonary/Chest: Effort normal. She has wheezes.  Diffuse expiratory wheezing  Lymphadenopathy:    She has no cervical adenopathy.  Neurological: She is alert and oriented to person, place, and time.  Skin:  Skin is warm and dry.  Psychiatric: She has a normal mood and affect. Her behavior is normal.        Assessment & Plan:  Asthma exacerbation. Refilled her albuterol to use every 4-6 hours as needed. Start her on Symbicort as well.Given Solu-Medrol and Depo-Medrol IM here in the office. Over the antibiotic below as well. Have her follow-up in 2 weeks. Initial peak flow 250. Repeat peak flow after one of the laser treatment was 300 still yellow zone and she still had some wheezing in her chest so did and then additional nebulizer treatment.[name]  Acute sinusitis-we'll treat with Augmentin. Call if not significantly better in one week.  Lightheadedness-patient started to feel lightheaded and dizzy after second albuterol neb treatment. We had her lay down on the bed and try to drink a cup of water. She felt better afterwards. She felt safe to drive home.

## 2016-12-30 DIAGNOSIS — R05 Cough: Secondary | ICD-10-CM | POA: Diagnosis not present

## 2016-12-30 DIAGNOSIS — J441 Chronic obstructive pulmonary disease with (acute) exacerbation: Secondary | ICD-10-CM | POA: Diagnosis not present

## 2016-12-30 DIAGNOSIS — G43909 Migraine, unspecified, not intractable, without status migrainosus: Secondary | ICD-10-CM | POA: Diagnosis not present

## 2016-12-30 DIAGNOSIS — R140 Abdominal distension (gaseous): Secondary | ICD-10-CM | POA: Diagnosis not present

## 2016-12-30 DIAGNOSIS — R079 Chest pain, unspecified: Secondary | ICD-10-CM | POA: Diagnosis not present

## 2016-12-30 DIAGNOSIS — R0602 Shortness of breath: Secondary | ICD-10-CM | POA: Diagnosis not present

## 2016-12-30 DIAGNOSIS — R109 Unspecified abdominal pain: Secondary | ICD-10-CM | POA: Diagnosis not present

## 2017-01-07 DIAGNOSIS — M542 Cervicalgia: Secondary | ICD-10-CM | POA: Diagnosis not present

## 2017-01-07 DIAGNOSIS — G35 Multiple sclerosis: Secondary | ICD-10-CM | POA: Diagnosis not present

## 2017-01-07 DIAGNOSIS — F331 Major depressive disorder, recurrent, moderate: Secondary | ICD-10-CM | POA: Diagnosis not present

## 2017-01-08 ENCOUNTER — Ambulatory Visit: Payer: Self-pay | Admitting: Family Medicine

## 2017-01-08 DIAGNOSIS — F3181 Bipolar II disorder: Secondary | ICD-10-CM | POA: Diagnosis not present

## 2017-01-08 DIAGNOSIS — F4310 Post-traumatic stress disorder, unspecified: Secondary | ICD-10-CM | POA: Diagnosis not present

## 2017-01-08 DIAGNOSIS — F1010 Alcohol abuse, uncomplicated: Secondary | ICD-10-CM | POA: Diagnosis not present

## 2017-01-08 DIAGNOSIS — Z79899 Other long term (current) drug therapy: Secondary | ICD-10-CM | POA: Diagnosis not present

## 2017-01-21 ENCOUNTER — Ambulatory Visit: Payer: Self-pay | Admitting: Family Medicine

## 2017-01-21 DIAGNOSIS — Z0189 Encounter for other specified special examinations: Secondary | ICD-10-CM

## 2017-02-25 DIAGNOSIS — Z79899 Other long term (current) drug therapy: Secondary | ICD-10-CM | POA: Diagnosis not present

## 2017-02-25 DIAGNOSIS — F331 Major depressive disorder, recurrent, moderate: Secondary | ICD-10-CM | POA: Diagnosis not present

## 2017-02-25 DIAGNOSIS — R269 Unspecified abnormalities of gait and mobility: Secondary | ICD-10-CM | POA: Diagnosis not present

## 2017-02-25 DIAGNOSIS — M542 Cervicalgia: Secondary | ICD-10-CM | POA: Diagnosis not present

## 2017-02-25 DIAGNOSIS — G35 Multiple sclerosis: Secondary | ICD-10-CM | POA: Diagnosis not present

## 2017-03-07 DIAGNOSIS — M79602 Pain in left arm: Secondary | ICD-10-CM | POA: Diagnosis not present

## 2017-03-07 DIAGNOSIS — R05 Cough: Secondary | ICD-10-CM | POA: Diagnosis not present

## 2017-03-07 DIAGNOSIS — M25512 Pain in left shoulder: Secondary | ICD-10-CM | POA: Diagnosis not present

## 2017-03-18 ENCOUNTER — Other Ambulatory Visit: Payer: Self-pay | Admitting: Family Medicine

## 2017-03-20 ENCOUNTER — Other Ambulatory Visit: Payer: Self-pay | Admitting: Family Medicine

## 2017-03-27 ENCOUNTER — Ambulatory Visit: Payer: Self-pay | Admitting: Family Medicine

## 2017-04-12 ENCOUNTER — Other Ambulatory Visit: Payer: Self-pay | Admitting: Family Medicine

## 2017-04-22 DIAGNOSIS — R269 Unspecified abnormalities of gait and mobility: Secondary | ICD-10-CM | POA: Diagnosis not present

## 2017-04-22 DIAGNOSIS — M542 Cervicalgia: Secondary | ICD-10-CM | POA: Diagnosis not present

## 2017-04-22 DIAGNOSIS — G35 Multiple sclerosis: Secondary | ICD-10-CM | POA: Diagnosis not present

## 2017-04-22 DIAGNOSIS — F331 Major depressive disorder, recurrent, moderate: Secondary | ICD-10-CM | POA: Diagnosis not present

## 2017-04-24 ENCOUNTER — Other Ambulatory Visit: Payer: Self-pay | Admitting: *Deleted

## 2017-04-24 MED ORDER — BUDESONIDE-FORMOTEROL FUMARATE 160-4.5 MCG/ACT IN AERO: INHALATION_SPRAY | RESPIRATORY_TRACT | 1 refills | Status: AC

## 2017-05-22 DIAGNOSIS — F319 Bipolar disorder, unspecified: Secondary | ICD-10-CM | POA: Diagnosis not present

## 2017-05-22 DIAGNOSIS — J449 Chronic obstructive pulmonary disease, unspecified: Secondary | ICD-10-CM | POA: Diagnosis not present

## 2017-05-22 DIAGNOSIS — R072 Precordial pain: Secondary | ICD-10-CM | POA: Diagnosis not present

## 2017-05-22 DIAGNOSIS — Z1231 Encounter for screening mammogram for malignant neoplasm of breast: Secondary | ICD-10-CM | POA: Diagnosis not present

## 2017-05-22 DIAGNOSIS — Z0000 Encounter for general adult medical examination without abnormal findings: Secondary | ICD-10-CM | POA: Diagnosis not present

## 2017-05-22 DIAGNOSIS — Z1211 Encounter for screening for malignant neoplasm of colon: Secondary | ICD-10-CM | POA: Diagnosis not present

## 2017-05-22 DIAGNOSIS — R635 Abnormal weight gain: Secondary | ICD-10-CM | POA: Diagnosis not present

## 2017-05-22 DIAGNOSIS — F329 Major depressive disorder, single episode, unspecified: Secondary | ICD-10-CM | POA: Diagnosis not present

## 2017-05-22 DIAGNOSIS — R5383 Other fatigue: Secondary | ICD-10-CM | POA: Diagnosis not present

## 2017-05-22 DIAGNOSIS — R601 Generalized edema: Secondary | ICD-10-CM | POA: Diagnosis not present

## 2017-05-25 DIAGNOSIS — M542 Cervicalgia: Secondary | ICD-10-CM | POA: Diagnosis not present

## 2017-05-25 DIAGNOSIS — F331 Major depressive disorder, recurrent, moderate: Secondary | ICD-10-CM | POA: Diagnosis not present

## 2017-05-25 DIAGNOSIS — G35 Multiple sclerosis: Secondary | ICD-10-CM | POA: Diagnosis not present

## 2017-06-02 DIAGNOSIS — F3181 Bipolar II disorder: Secondary | ICD-10-CM | POA: Diagnosis not present

## 2017-06-03 DIAGNOSIS — Z803 Family history of malignant neoplasm of breast: Secondary | ICD-10-CM | POA: Diagnosis not present

## 2017-06-03 DIAGNOSIS — R928 Other abnormal and inconclusive findings on diagnostic imaging of breast: Secondary | ICD-10-CM | POA: Diagnosis not present

## 2017-06-03 DIAGNOSIS — Z1231 Encounter for screening mammogram for malignant neoplasm of breast: Secondary | ICD-10-CM | POA: Diagnosis not present

## 2017-06-03 DIAGNOSIS — Z86018 Personal history of other benign neoplasm: Secondary | ICD-10-CM | POA: Diagnosis not present

## 2017-06-12 DIAGNOSIS — G35 Multiple sclerosis: Secondary | ICD-10-CM | POA: Diagnosis not present

## 2017-06-12 DIAGNOSIS — Z91018 Allergy to other foods: Secondary | ICD-10-CM | POA: Diagnosis not present

## 2017-06-12 DIAGNOSIS — I10 Essential (primary) hypertension: Secondary | ICD-10-CM | POA: Diagnosis not present

## 2017-06-12 DIAGNOSIS — R109 Unspecified abdominal pain: Secondary | ICD-10-CM | POA: Diagnosis not present

## 2017-06-12 DIAGNOSIS — R0600 Dyspnea, unspecified: Secondary | ICD-10-CM | POA: Diagnosis not present

## 2017-06-12 DIAGNOSIS — F17210 Nicotine dependence, cigarettes, uncomplicated: Secondary | ICD-10-CM | POA: Diagnosis not present

## 2017-06-12 DIAGNOSIS — R079 Chest pain, unspecified: Secondary | ICD-10-CM | POA: Diagnosis not present

## 2017-06-12 DIAGNOSIS — R1012 Left upper quadrant pain: Secondary | ICD-10-CM | POA: Diagnosis not present

## 2017-06-12 DIAGNOSIS — Z888 Allergy status to other drugs, medicaments and biological substances status: Secondary | ICD-10-CM | POA: Diagnosis not present

## 2017-06-12 DIAGNOSIS — R0789 Other chest pain: Secondary | ICD-10-CM | POA: Diagnosis not present

## 2017-06-12 DIAGNOSIS — R000 Tachycardia, unspecified: Secondary | ICD-10-CM | POA: Diagnosis not present

## 2017-06-12 DIAGNOSIS — Z7982 Long term (current) use of aspirin: Secondary | ICD-10-CM | POA: Diagnosis not present

## 2017-06-12 DIAGNOSIS — J45909 Unspecified asthma, uncomplicated: Secondary | ICD-10-CM | POA: Diagnosis not present

## 2017-06-13 DIAGNOSIS — R109 Unspecified abdominal pain: Secondary | ICD-10-CM | POA: Diagnosis not present

## 2017-06-13 DIAGNOSIS — R079 Chest pain, unspecified: Secondary | ICD-10-CM | POA: Diagnosis not present

## 2017-06-13 DIAGNOSIS — R0600 Dyspnea, unspecified: Secondary | ICD-10-CM | POA: Diagnosis not present

## 2017-06-25 DIAGNOSIS — M25552 Pain in left hip: Secondary | ICD-10-CM | POA: Diagnosis not present

## 2017-06-25 DIAGNOSIS — Z79899 Other long term (current) drug therapy: Secondary | ICD-10-CM | POA: Diagnosis not present

## 2017-06-25 DIAGNOSIS — M160 Bilateral primary osteoarthritis of hip: Secondary | ICD-10-CM | POA: Diagnosis not present

## 2017-06-25 DIAGNOSIS — F331 Major depressive disorder, recurrent, moderate: Secondary | ICD-10-CM | POA: Diagnosis not present

## 2017-06-25 DIAGNOSIS — M542 Cervicalgia: Secondary | ICD-10-CM | POA: Diagnosis not present

## 2017-06-25 DIAGNOSIS — G35 Multiple sclerosis: Secondary | ICD-10-CM | POA: Diagnosis not present

## 2017-06-25 DIAGNOSIS — M25551 Pain in right hip: Secondary | ICD-10-CM | POA: Diagnosis not present

## 2017-07-01 DIAGNOSIS — F3181 Bipolar II disorder: Secondary | ICD-10-CM | POA: Diagnosis not present

## 2017-07-08 DIAGNOSIS — H40019 Open angle with borderline findings, low risk, unspecified eye: Secondary | ICD-10-CM | POA: Diagnosis not present

## 2017-07-08 DIAGNOSIS — H40013 Open angle with borderline findings, low risk, bilateral: Secondary | ICD-10-CM | POA: Diagnosis not present

## 2017-07-08 DIAGNOSIS — H527 Unspecified disorder of refraction: Secondary | ICD-10-CM | POA: Diagnosis not present

## 2017-07-29 DIAGNOSIS — F3181 Bipolar II disorder: Secondary | ICD-10-CM | POA: Diagnosis not present

## 2017-08-06 DIAGNOSIS — J45901 Unspecified asthma with (acute) exacerbation: Secondary | ICD-10-CM | POA: Diagnosis not present

## 2017-08-06 DIAGNOSIS — F319 Bipolar disorder, unspecified: Secondary | ICD-10-CM | POA: Diagnosis not present

## 2017-08-06 DIAGNOSIS — G35 Multiple sclerosis: Secondary | ICD-10-CM | POA: Diagnosis not present

## 2017-08-06 DIAGNOSIS — I10 Essential (primary) hypertension: Secondary | ICD-10-CM | POA: Diagnosis not present

## 2017-08-06 DIAGNOSIS — J0100 Acute maxillary sinusitis, unspecified: Secondary | ICD-10-CM | POA: Diagnosis not present

## 2017-08-06 DIAGNOSIS — M25572 Pain in left ankle and joints of left foot: Secondary | ICD-10-CM | POA: Diagnosis not present

## 2017-08-12 DIAGNOSIS — M25572 Pain in left ankle and joints of left foot: Secondary | ICD-10-CM | POA: Diagnosis not present

## 2017-08-12 DIAGNOSIS — R0989 Other specified symptoms and signs involving the circulatory and respiratory systems: Secondary | ICD-10-CM | POA: Diagnosis not present

## 2017-08-12 DIAGNOSIS — J45901 Unspecified asthma with (acute) exacerbation: Secondary | ICD-10-CM | POA: Diagnosis not present

## 2017-08-22 DIAGNOSIS — J45901 Unspecified asthma with (acute) exacerbation: Secondary | ICD-10-CM | POA: Diagnosis not present

## 2017-08-22 DIAGNOSIS — E876 Hypokalemia: Secondary | ICD-10-CM | POA: Diagnosis not present

## 2017-08-22 DIAGNOSIS — Z7982 Long term (current) use of aspirin: Secondary | ICD-10-CM | POA: Diagnosis not present

## 2017-08-22 DIAGNOSIS — R945 Abnormal results of liver function studies: Secondary | ICD-10-CM | POA: Diagnosis not present

## 2017-08-22 DIAGNOSIS — I10 Essential (primary) hypertension: Secondary | ICD-10-CM | POA: Diagnosis not present

## 2017-08-22 DIAGNOSIS — R918 Other nonspecific abnormal finding of lung field: Secondary | ICD-10-CM | POA: Diagnosis not present

## 2017-08-22 DIAGNOSIS — R000 Tachycardia, unspecified: Secondary | ICD-10-CM | POA: Diagnosis not present

## 2017-08-22 DIAGNOSIS — J189 Pneumonia, unspecified organism: Secondary | ICD-10-CM | POA: Diagnosis not present

## 2017-08-22 DIAGNOSIS — R590 Localized enlarged lymph nodes: Secondary | ICD-10-CM | POA: Diagnosis not present

## 2017-08-22 DIAGNOSIS — R0600 Dyspnea, unspecified: Secondary | ICD-10-CM | POA: Diagnosis not present

## 2017-08-22 DIAGNOSIS — G35 Multiple sclerosis: Secondary | ICD-10-CM | POA: Diagnosis not present

## 2017-08-22 DIAGNOSIS — F319 Bipolar disorder, unspecified: Secondary | ICD-10-CM | POA: Diagnosis not present

## 2017-08-23 DIAGNOSIS — E876 Hypokalemia: Secondary | ICD-10-CM | POA: Diagnosis not present

## 2017-08-23 DIAGNOSIS — R918 Other nonspecific abnormal finding of lung field: Secondary | ICD-10-CM | POA: Diagnosis not present

## 2017-08-23 DIAGNOSIS — J45901 Unspecified asthma with (acute) exacerbation: Secondary | ICD-10-CM | POA: Diagnosis not present

## 2017-08-23 DIAGNOSIS — R590 Localized enlarged lymph nodes: Secondary | ICD-10-CM | POA: Diagnosis not present

## 2017-08-23 DIAGNOSIS — R0600 Dyspnea, unspecified: Secondary | ICD-10-CM | POA: Diagnosis not present

## 2017-08-23 DIAGNOSIS — R000 Tachycardia, unspecified: Secondary | ICD-10-CM | POA: Diagnosis not present

## 2017-08-23 DIAGNOSIS — I10 Essential (primary) hypertension: Secondary | ICD-10-CM | POA: Diagnosis not present

## 2017-08-23 DIAGNOSIS — F319 Bipolar disorder, unspecified: Secondary | ICD-10-CM | POA: Diagnosis not present

## 2017-08-23 DIAGNOSIS — Z7982 Long term (current) use of aspirin: Secondary | ICD-10-CM | POA: Diagnosis not present

## 2017-08-23 DIAGNOSIS — G35 Multiple sclerosis: Secondary | ICD-10-CM | POA: Diagnosis not present

## 2017-08-23 DIAGNOSIS — R945 Abnormal results of liver function studies: Secondary | ICD-10-CM | POA: Diagnosis not present

## 2017-08-24 DIAGNOSIS — F319 Bipolar disorder, unspecified: Secondary | ICD-10-CM | POA: Diagnosis not present

## 2017-08-24 DIAGNOSIS — J45901 Unspecified asthma with (acute) exacerbation: Secondary | ICD-10-CM | POA: Diagnosis not present

## 2017-08-24 DIAGNOSIS — G35 Multiple sclerosis: Secondary | ICD-10-CM | POA: Diagnosis not present

## 2017-08-24 DIAGNOSIS — E876 Hypokalemia: Secondary | ICD-10-CM | POA: Diagnosis not present

## 2017-08-24 DIAGNOSIS — J181 Lobar pneumonia, unspecified organism: Secondary | ICD-10-CM | POA: Diagnosis not present

## 2017-08-24 DIAGNOSIS — R945 Abnormal results of liver function studies: Secondary | ICD-10-CM | POA: Diagnosis not present

## 2017-10-25 IMAGING — US US ABDOMEN LIMITED
1 series · 14 of 25 positions shown · non-contrast
Comparison: 05/21/2011.

CLINICAL DATA: Elevated liver function tests. History of a
cholecystectomy.

EXAM:
US ABDOMEN LIMITED - RIGHT UPPER QUADRANT

[Series 1: us abdomen limited · 0.20mm/px · 14 of 25 slices shown]
[im 1/25]
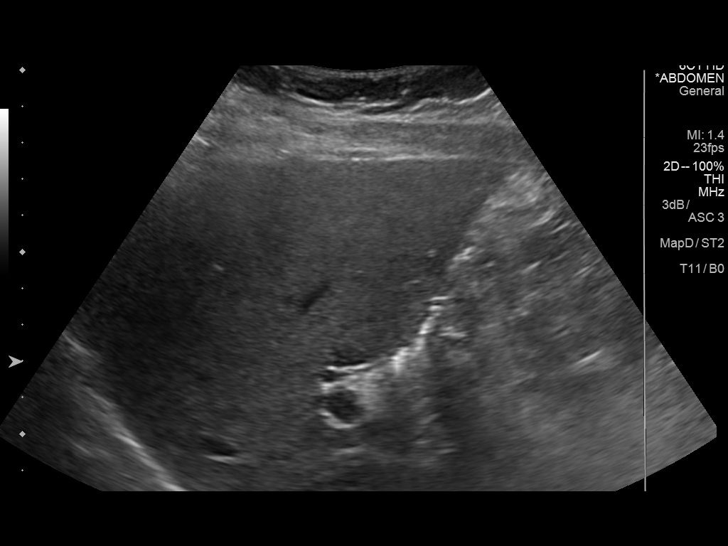
[im 3/25]
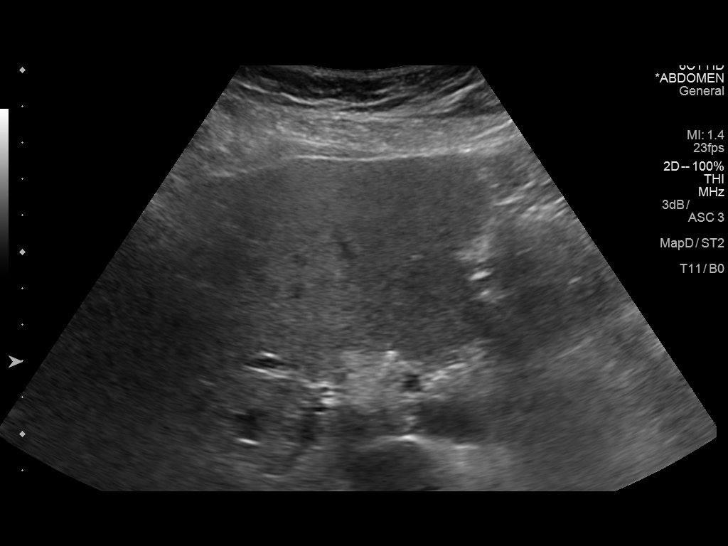
[im 5/25]
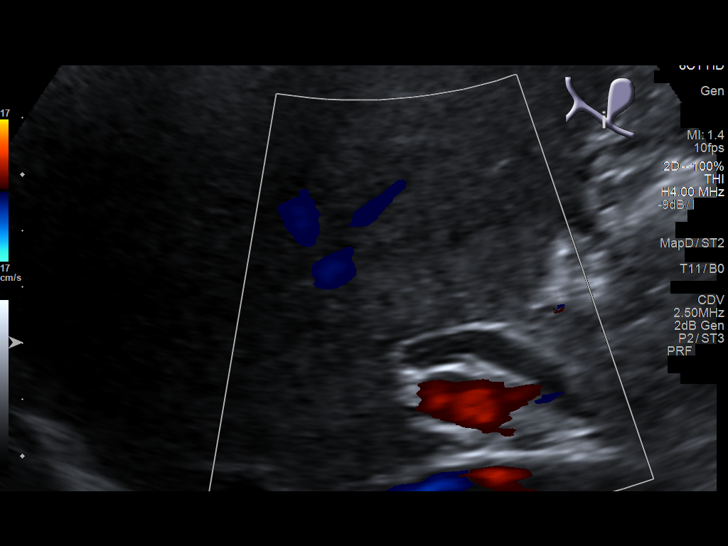
[im 7/25]
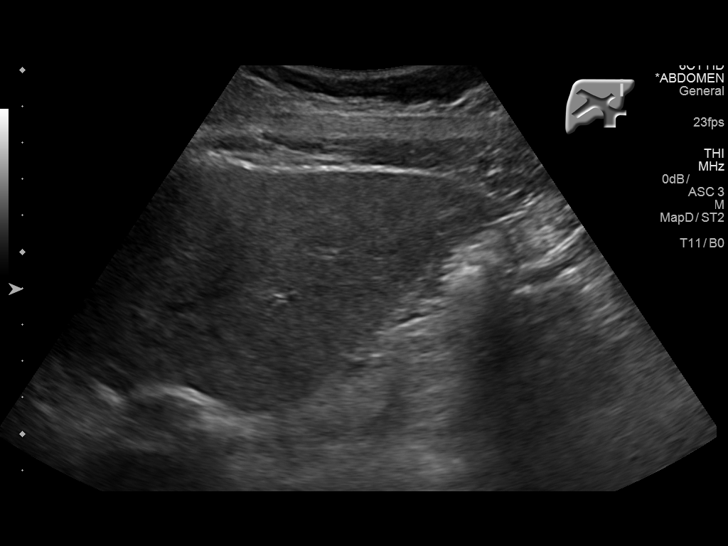
[im 9/25]
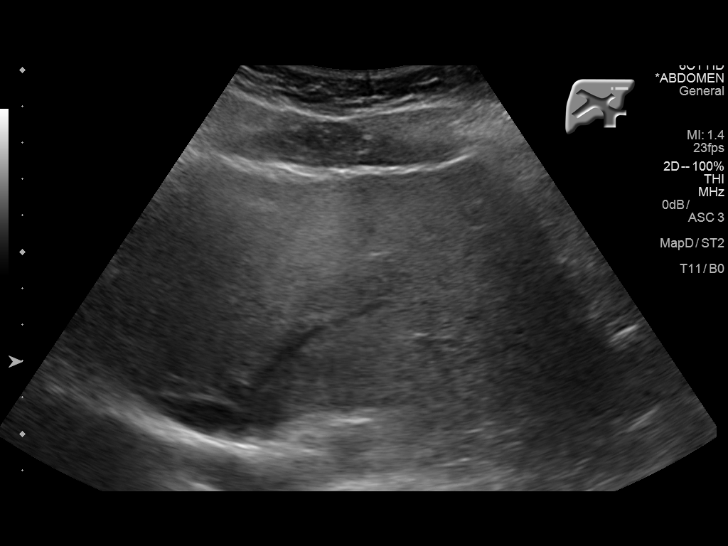
[im 10/25]
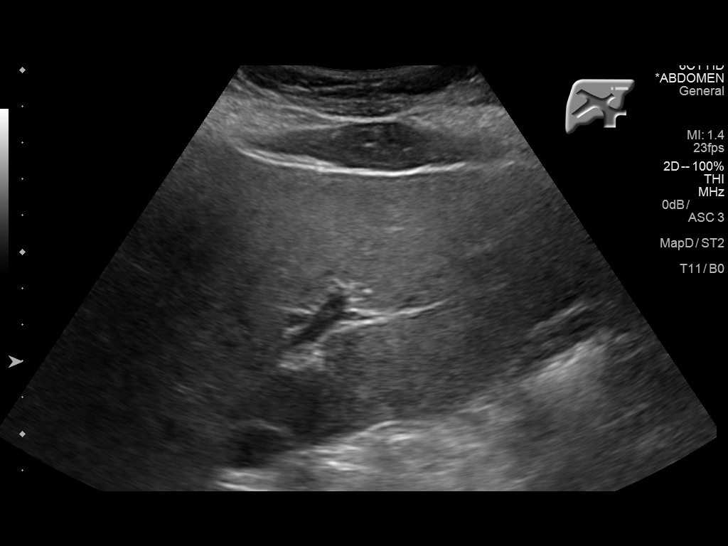
[im 12/25]
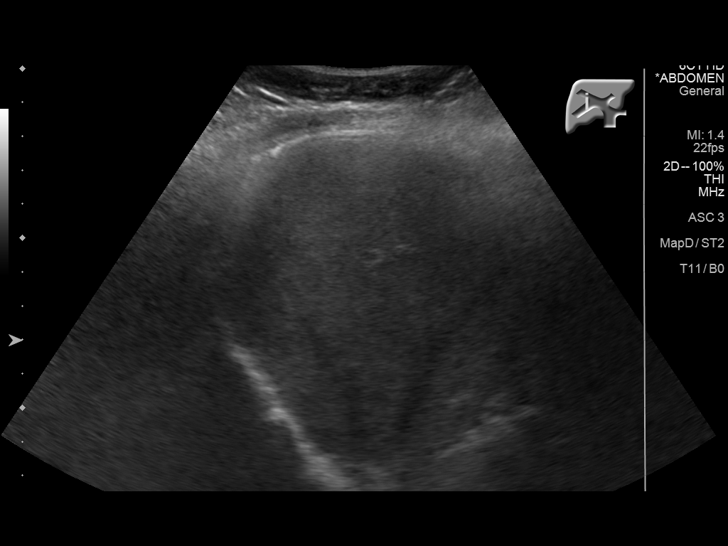
[im 14/25]
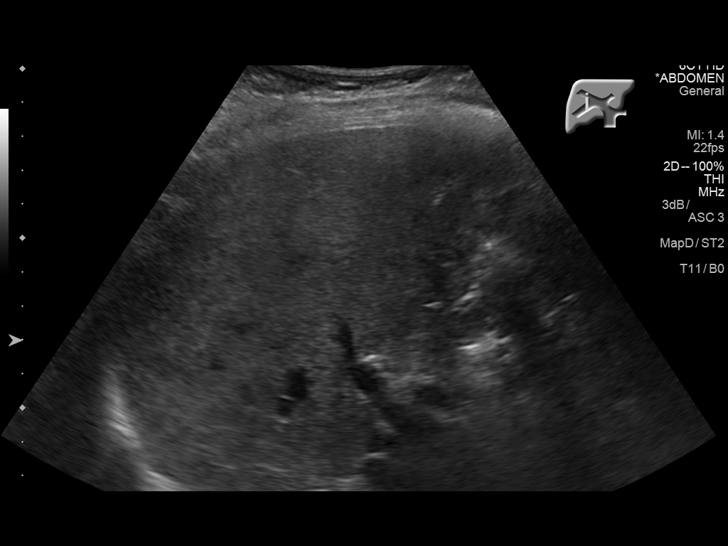
[im 16/25]
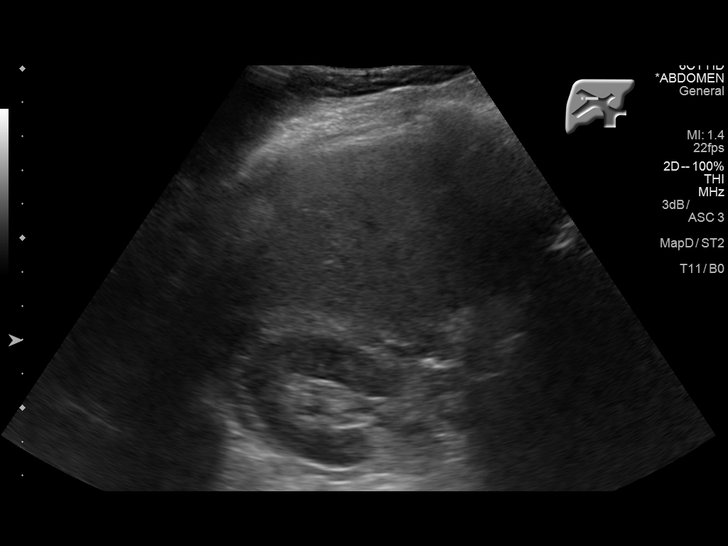
[im 17/25]
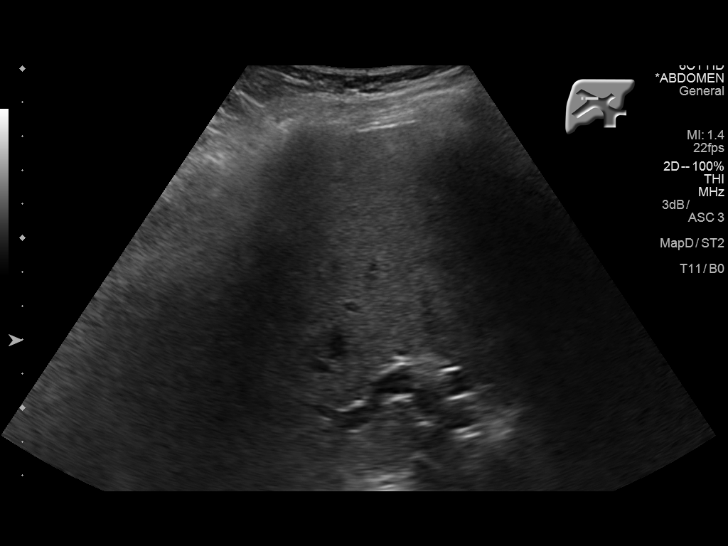
[im 19/25]
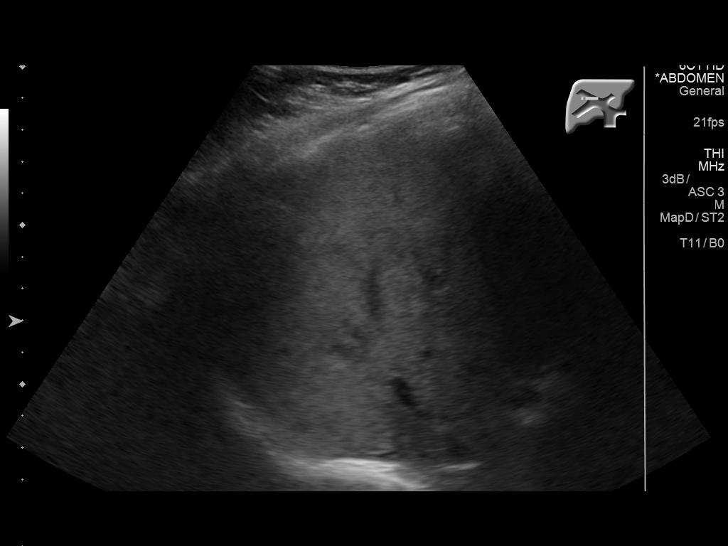
[im 21/25]
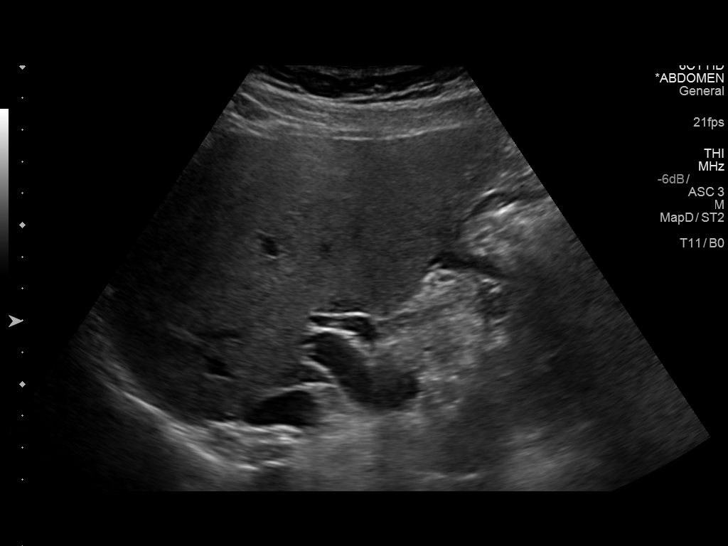
[im 23/25]
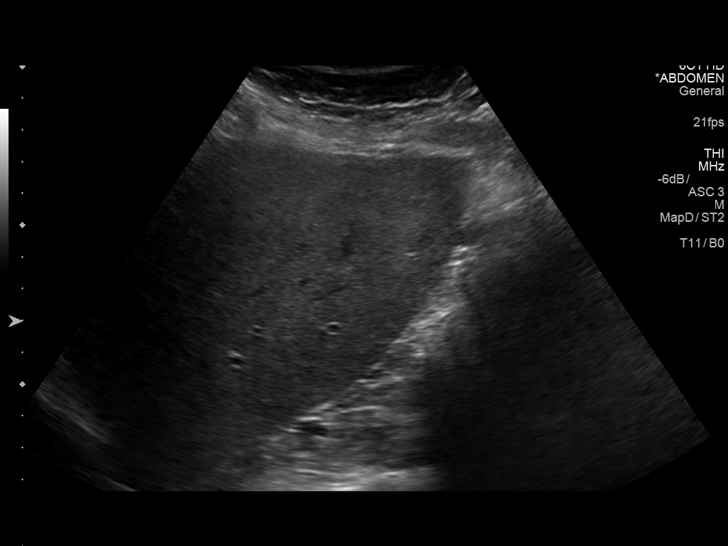
[im 25/25]
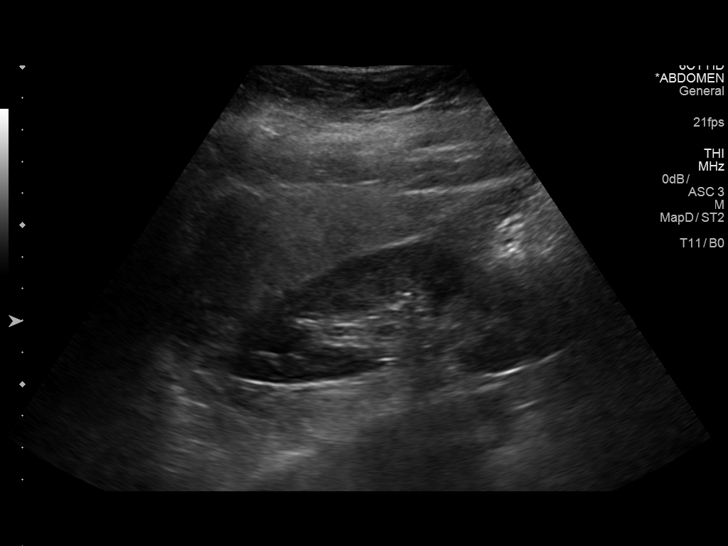

[14 of 25 positions shown; findings below may reference images not displayed]

FINDINGS: Gallbladder:

Surgically absent.

Common bile duct:

Diameter: 4 mm

Liver:

Mild increased parenchymal echogenicity. Normal in size. No mass or
focal lesion.
IMPRESSION: 1. No acute findings.  No bile duct dilation.
2. Mild increased liver parenchymal echogenicity. This is
nonspecific but suggests hepatic steatosis. Liver otherwise
unremarkable.

## 2017-10-30 IMAGING — DX DG CHEST 2V
2 series · 2 of 2 positions shown · non-contrast
Comparison: PA and lateral chest x-ray August 30, 2008

CLINICAL DATA: Cough, shortness of breath, bilateral lower
extremity edema for the past 3-4 weeks; history of asthma and
patient is a current smoker ; history of multiple sclerosis.

EXAM:
CHEST  2 VIEW

[chest pa]
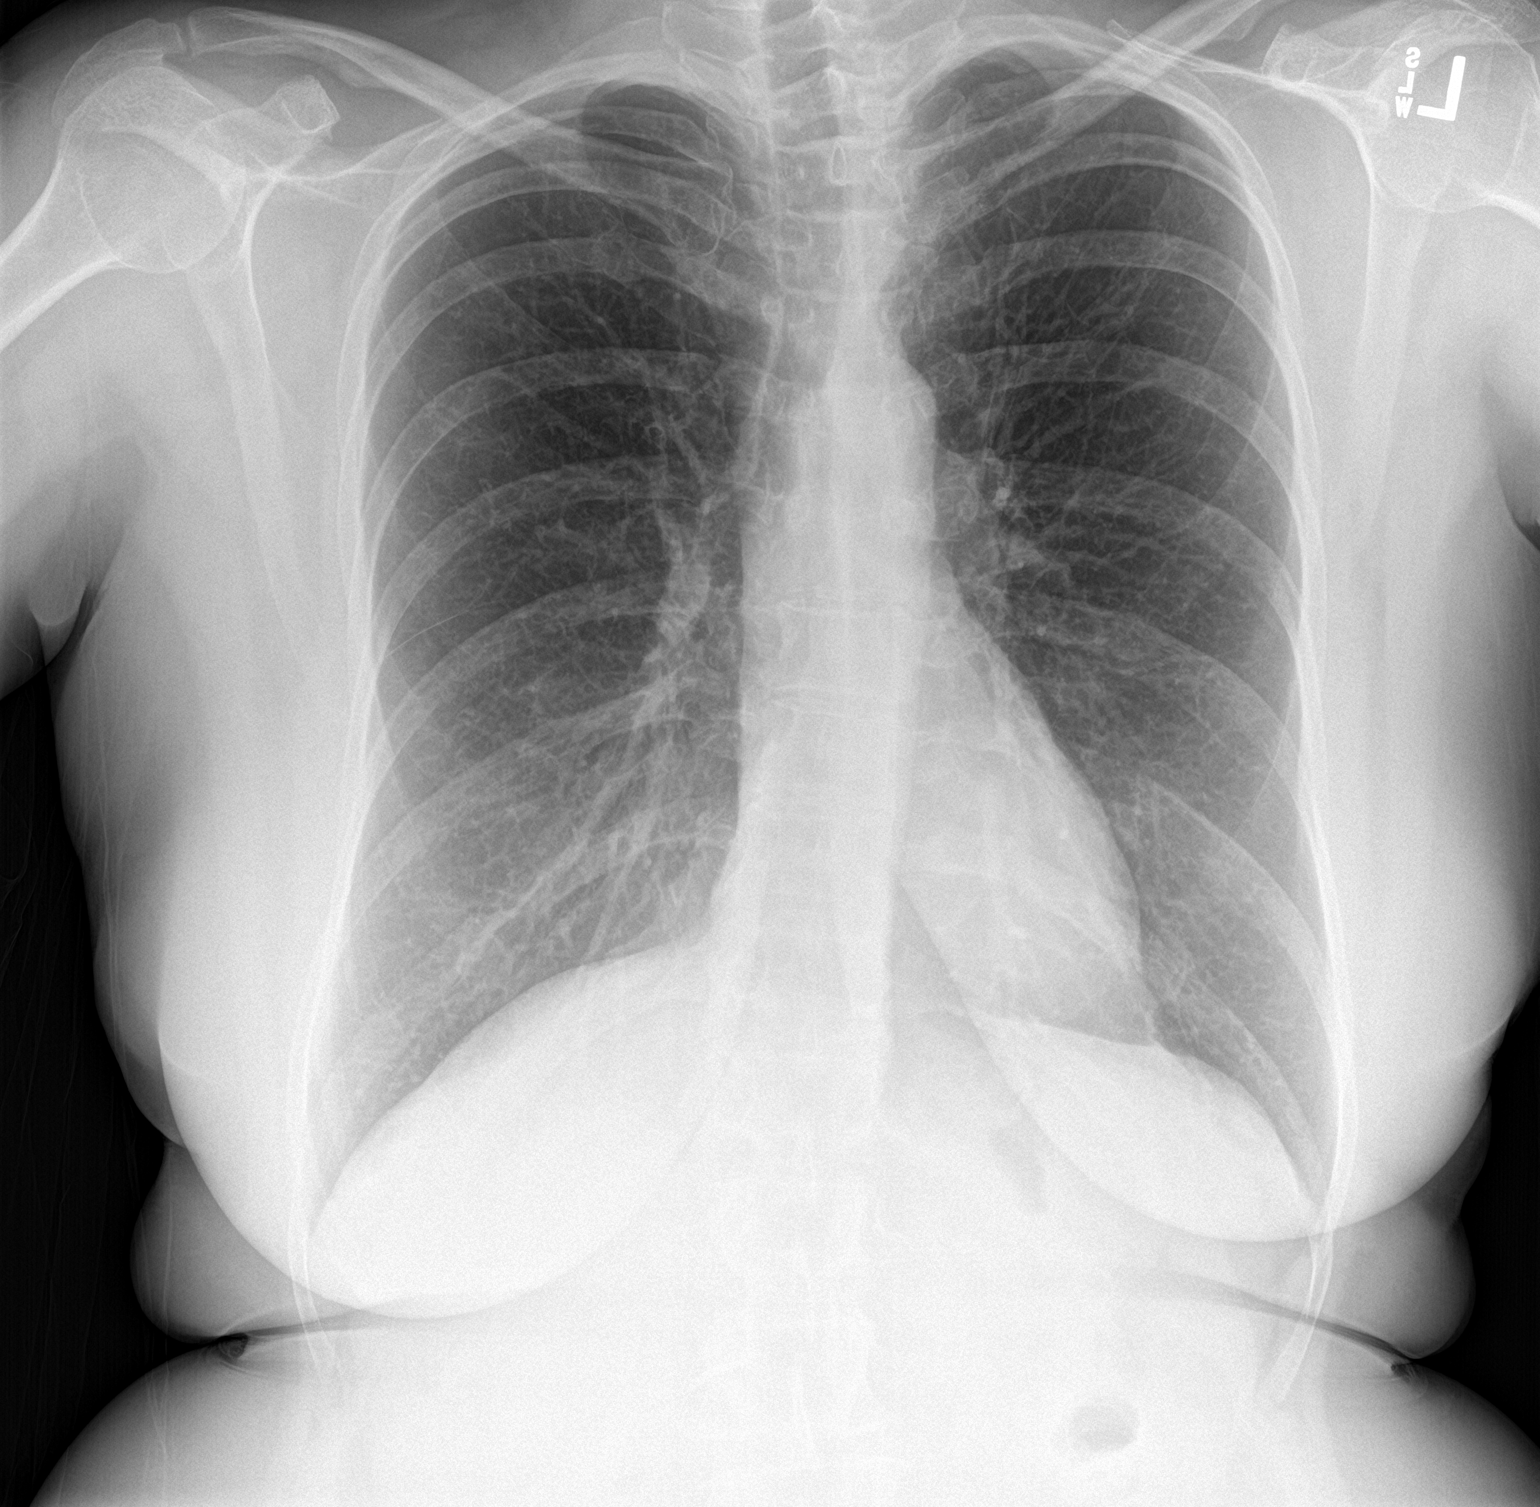

[chest lat]
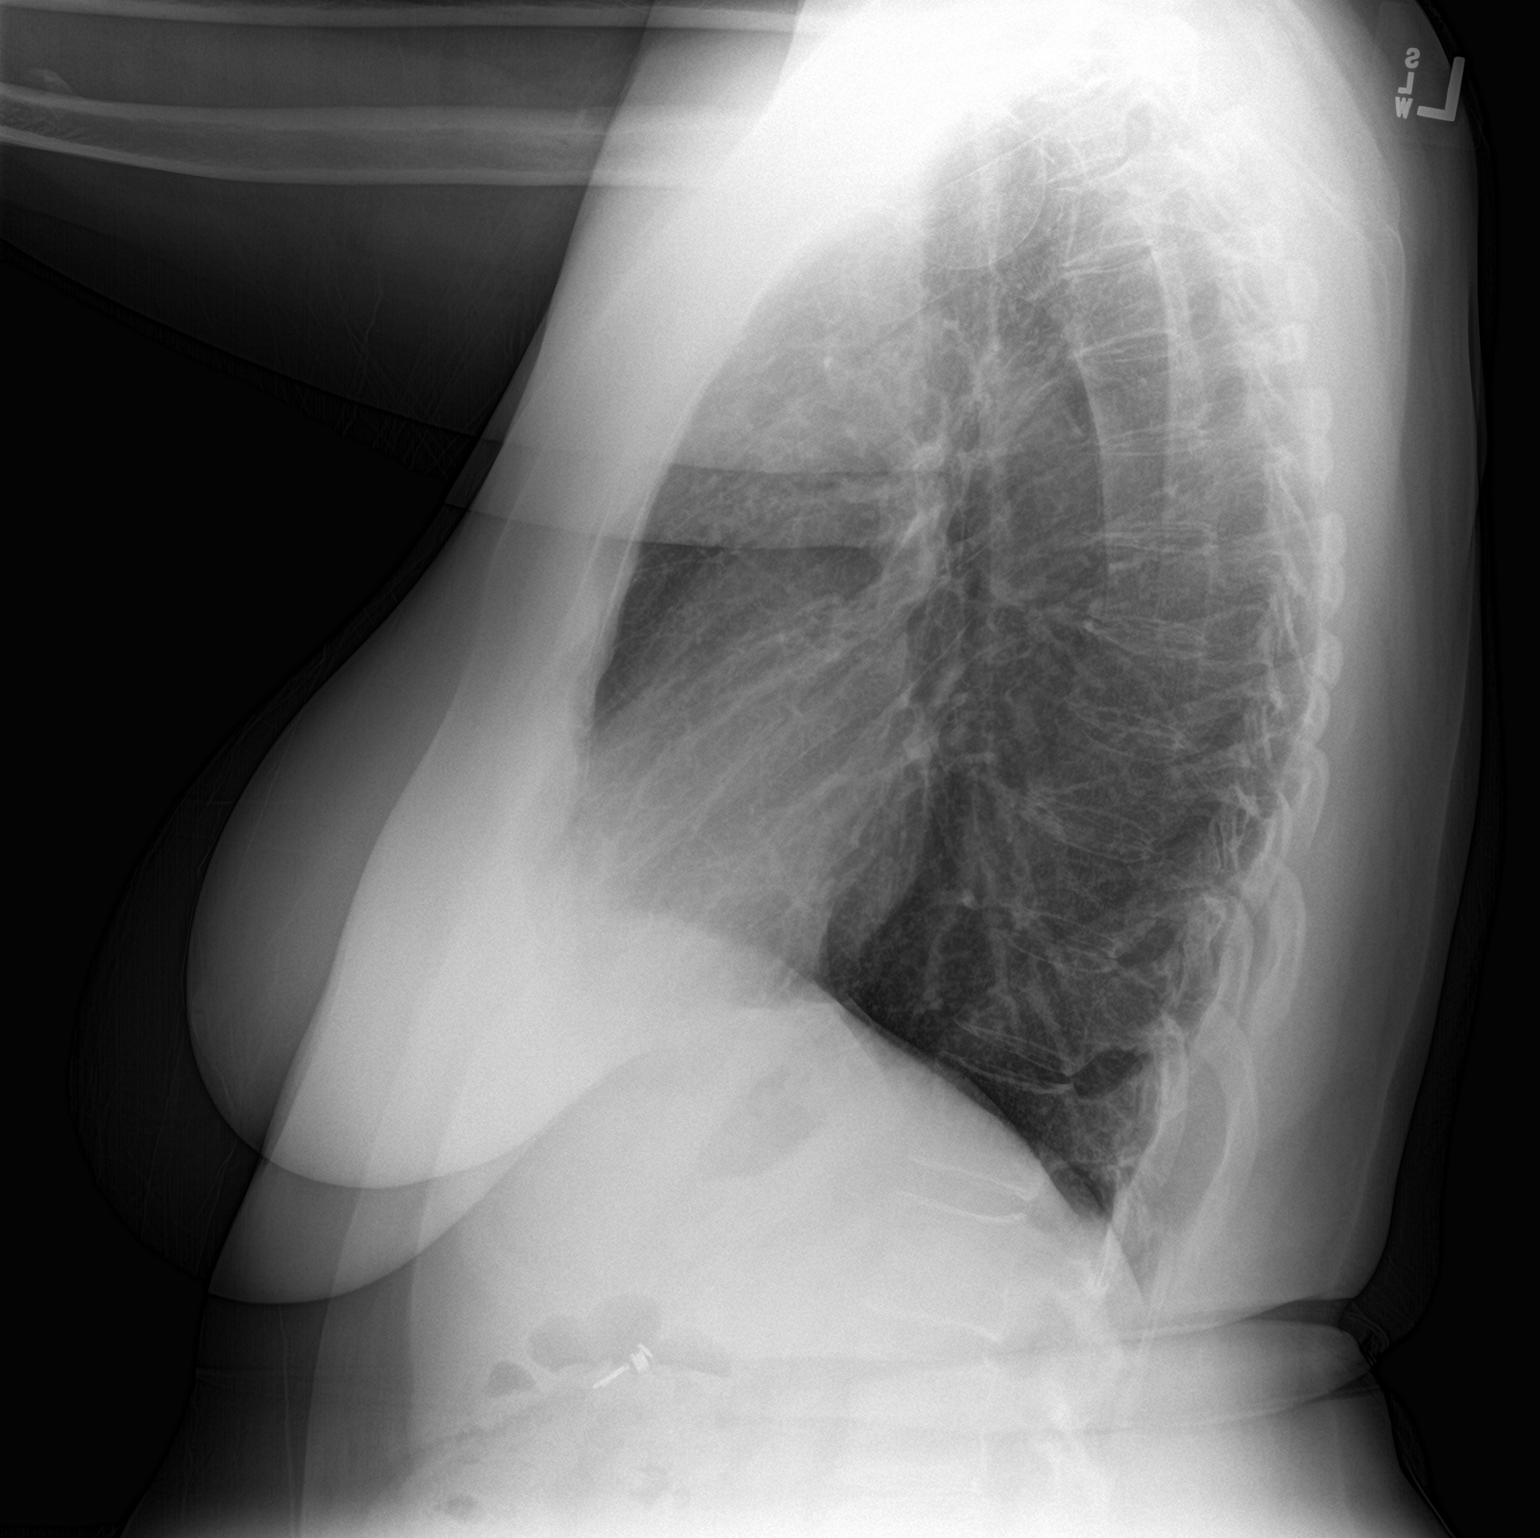

[2 of 2 positions shown; findings below may reference images not displayed]

FINDINGS: The lungs are mildly hyperinflated and clear. The heart and
pulmonary vascularity are normal. The mediastinum is normal in
width. There is no pleural effusion. The bony thorax exhibits no
acute abnormality.
IMPRESSION: Mild hyper inflation consistent with known reactive airway disease.
There is no pneumonia nor other active cardiopulmonary disease.

## 2017-12-17 DIAGNOSIS — F331 Major depressive disorder, recurrent, moderate: Secondary | ICD-10-CM | POA: Diagnosis not present

## 2017-12-17 DIAGNOSIS — M542 Cervicalgia: Secondary | ICD-10-CM | POA: Diagnosis not present

## 2017-12-17 DIAGNOSIS — G35 Multiple sclerosis: Secondary | ICD-10-CM | POA: Diagnosis not present

## 2017-12-17 DIAGNOSIS — M546 Pain in thoracic spine: Secondary | ICD-10-CM | POA: Diagnosis not present

## 2018-01-07 DIAGNOSIS — G35 Multiple sclerosis: Secondary | ICD-10-CM | POA: Diagnosis not present

## 2018-01-07 DIAGNOSIS — M50322 Other cervical disc degeneration at C5-C6 level: Secondary | ICD-10-CM | POA: Diagnosis not present

## 2018-01-07 DIAGNOSIS — R9082 White matter disease, unspecified: Secondary | ICD-10-CM | POA: Diagnosis not present

## 2018-01-07 DIAGNOSIS — M47892 Other spondylosis, cervical region: Secondary | ICD-10-CM | POA: Diagnosis not present

## 2018-01-07 DIAGNOSIS — M47812 Spondylosis without myelopathy or radiculopathy, cervical region: Secondary | ICD-10-CM | POA: Diagnosis not present

## 2018-01-07 DIAGNOSIS — M4802 Spinal stenosis, cervical region: Secondary | ICD-10-CM | POA: Diagnosis not present

## 2018-01-22 DIAGNOSIS — J45901 Unspecified asthma with (acute) exacerbation: Secondary | ICD-10-CM | POA: Diagnosis not present

## 2018-02-21 DIAGNOSIS — J45901 Unspecified asthma with (acute) exacerbation: Secondary | ICD-10-CM | POA: Diagnosis not present

## 2018-03-24 DIAGNOSIS — J45901 Unspecified asthma with (acute) exacerbation: Secondary | ICD-10-CM | POA: Diagnosis not present

## 2018-03-25 DIAGNOSIS — M546 Pain in thoracic spine: Secondary | ICD-10-CM | POA: Diagnosis not present

## 2018-03-25 DIAGNOSIS — M542 Cervicalgia: Secondary | ICD-10-CM | POA: Diagnosis not present

## 2018-03-25 DIAGNOSIS — G35 Multiple sclerosis: Secondary | ICD-10-CM | POA: Diagnosis not present

## 2018-03-25 DIAGNOSIS — F331 Major depressive disorder, recurrent, moderate: Secondary | ICD-10-CM | POA: Diagnosis not present

## 2018-04-24 DIAGNOSIS — J45901 Unspecified asthma with (acute) exacerbation: Secondary | ICD-10-CM | POA: Diagnosis not present

## 2018-05-04 DIAGNOSIS — I10 Essential (primary) hypertension: Secondary | ICD-10-CM | POA: Diagnosis not present

## 2018-05-04 DIAGNOSIS — R8290 Unspecified abnormal findings in urine: Secondary | ICD-10-CM | POA: Diagnosis not present

## 2018-05-04 DIAGNOSIS — F3181 Bipolar II disorder: Secondary | ICD-10-CM | POA: Diagnosis not present

## 2018-05-04 DIAGNOSIS — H9203 Otalgia, bilateral: Secondary | ICD-10-CM | POA: Diagnosis not present

## 2018-05-04 DIAGNOSIS — G35 Multiple sclerosis: Secondary | ICD-10-CM | POA: Diagnosis not present

## 2018-05-04 DIAGNOSIS — N390 Urinary tract infection, site not specified: Secondary | ICD-10-CM | POA: Diagnosis not present

## 2018-05-04 DIAGNOSIS — G44049 Chronic paroxysmal hemicrania, not intractable: Secondary | ICD-10-CM | POA: Diagnosis not present

## 2018-05-24 DIAGNOSIS — J45901 Unspecified asthma with (acute) exacerbation: Secondary | ICD-10-CM | POA: Diagnosis not present

## 2018-06-09 DIAGNOSIS — I10 Essential (primary) hypertension: Secondary | ICD-10-CM | POA: Diagnosis not present

## 2018-06-09 DIAGNOSIS — J45909 Unspecified asthma, uncomplicated: Secondary | ICD-10-CM | POA: Diagnosis not present

## 2018-06-09 DIAGNOSIS — S1012XA Blister (nonthermal) of throat, initial encounter: Secondary | ICD-10-CM | POA: Diagnosis not present

## 2018-06-09 DIAGNOSIS — Z79899 Other long term (current) drug therapy: Secondary | ICD-10-CM | POA: Diagnosis not present

## 2018-06-09 DIAGNOSIS — R05 Cough: Secondary | ICD-10-CM | POA: Diagnosis not present

## 2018-06-09 DIAGNOSIS — F17210 Nicotine dependence, cigarettes, uncomplicated: Secondary | ICD-10-CM | POA: Diagnosis not present

## 2018-06-09 DIAGNOSIS — R030 Elevated blood-pressure reading, without diagnosis of hypertension: Secondary | ICD-10-CM | POA: Diagnosis not present

## 2018-06-09 DIAGNOSIS — J029 Acute pharyngitis, unspecified: Secondary | ICD-10-CM | POA: Diagnosis not present

## 2018-06-09 DIAGNOSIS — Z7982 Long term (current) use of aspirin: Secondary | ICD-10-CM | POA: Diagnosis not present

## 2018-06-09 DIAGNOSIS — F410 Panic disorder [episodic paroxysmal anxiety] without agoraphobia: Secondary | ICD-10-CM | POA: Diagnosis not present

## 2018-06-11 DIAGNOSIS — J029 Acute pharyngitis, unspecified: Secondary | ICD-10-CM | POA: Diagnosis not present

## 2018-06-11 DIAGNOSIS — I10 Essential (primary) hypertension: Secondary | ICD-10-CM | POA: Diagnosis not present

## 2018-06-11 DIAGNOSIS — R319 Hematuria, unspecified: Secondary | ICD-10-CM | POA: Diagnosis not present

## 2018-06-17 DIAGNOSIS — R0602 Shortness of breath: Secondary | ICD-10-CM | POA: Diagnosis not present

## 2018-06-17 DIAGNOSIS — I10 Essential (primary) hypertension: Secondary | ICD-10-CM | POA: Diagnosis not present

## 2018-06-21 IMAGING — DX DG CHEST 2V
2 series · 2 of 2 positions shown · non-contrast
Comparison: Chest radiograph performed 02/08/2016

CLINICAL DATA: Chronic shortness of breath for 2 months. Initial
encounter.

EXAM:
CHEST  2 VIEW

[chest pa]
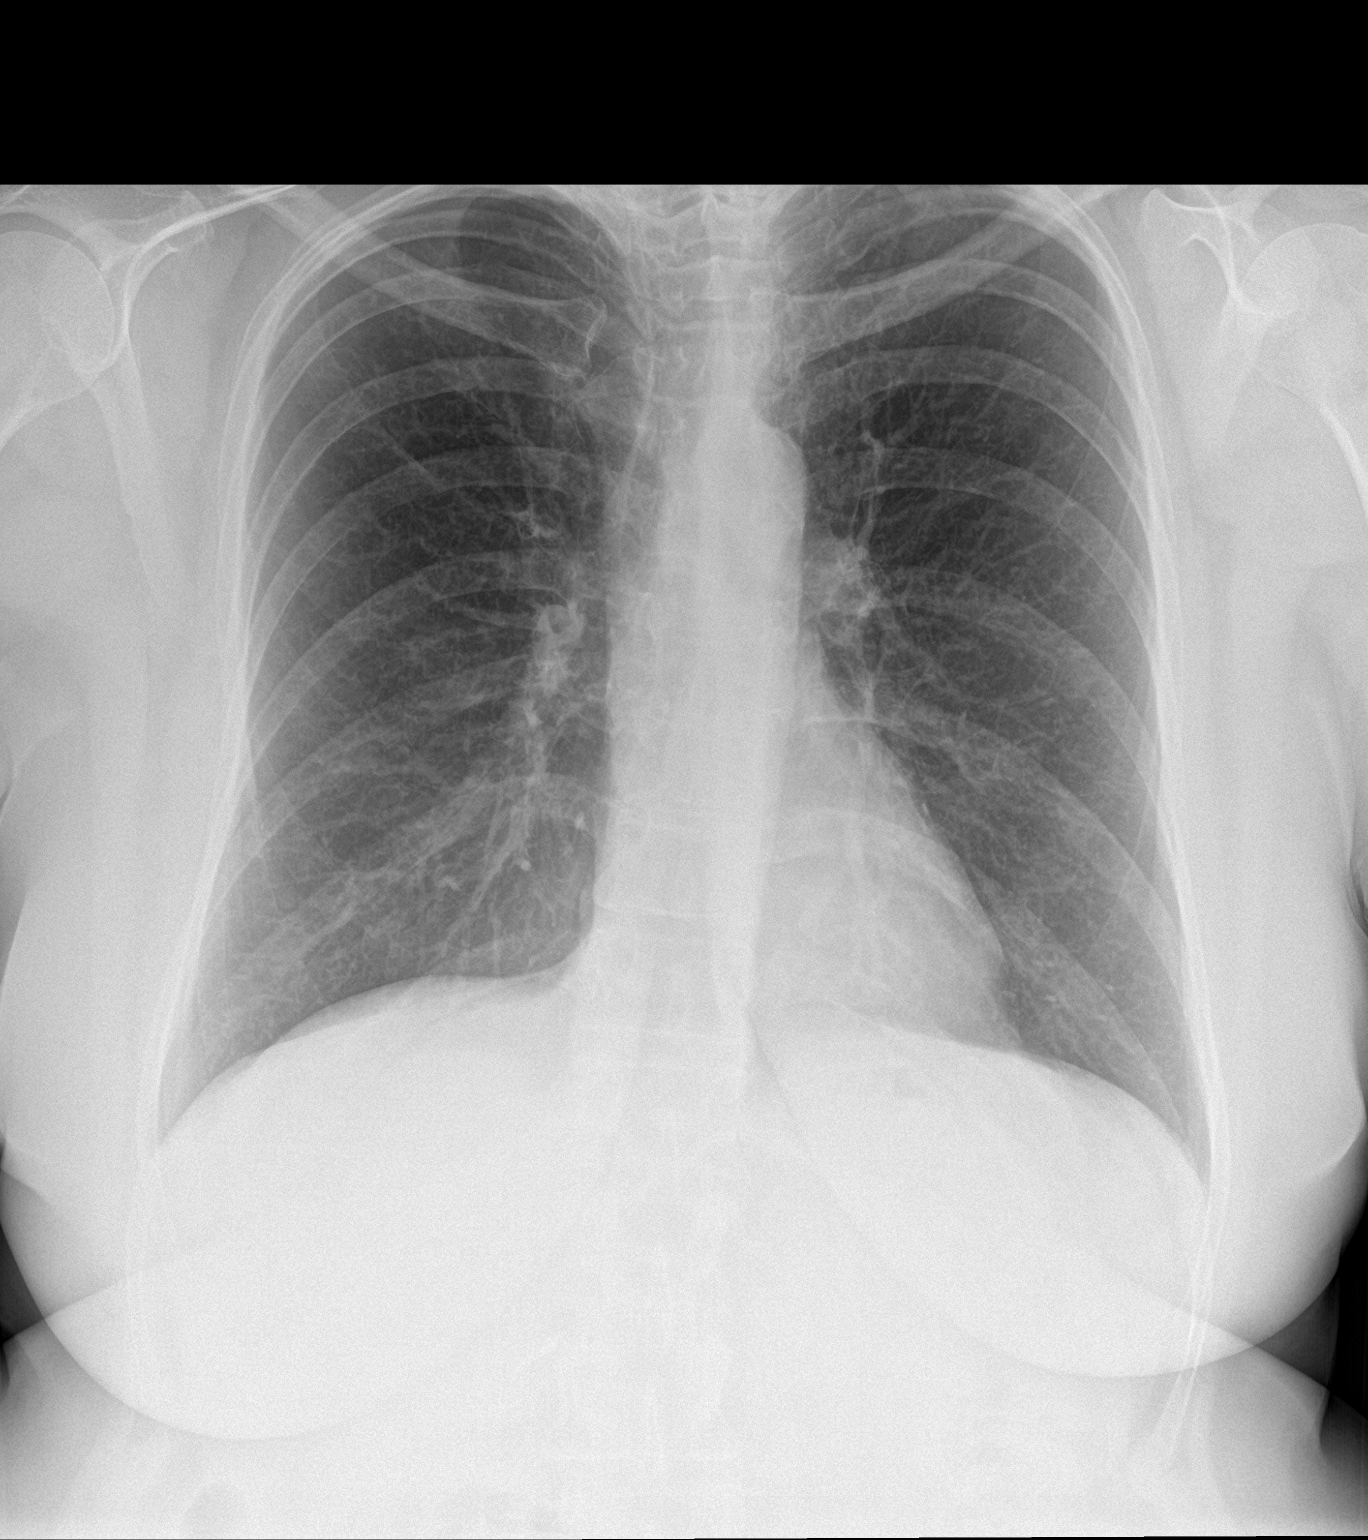

[chest lat]
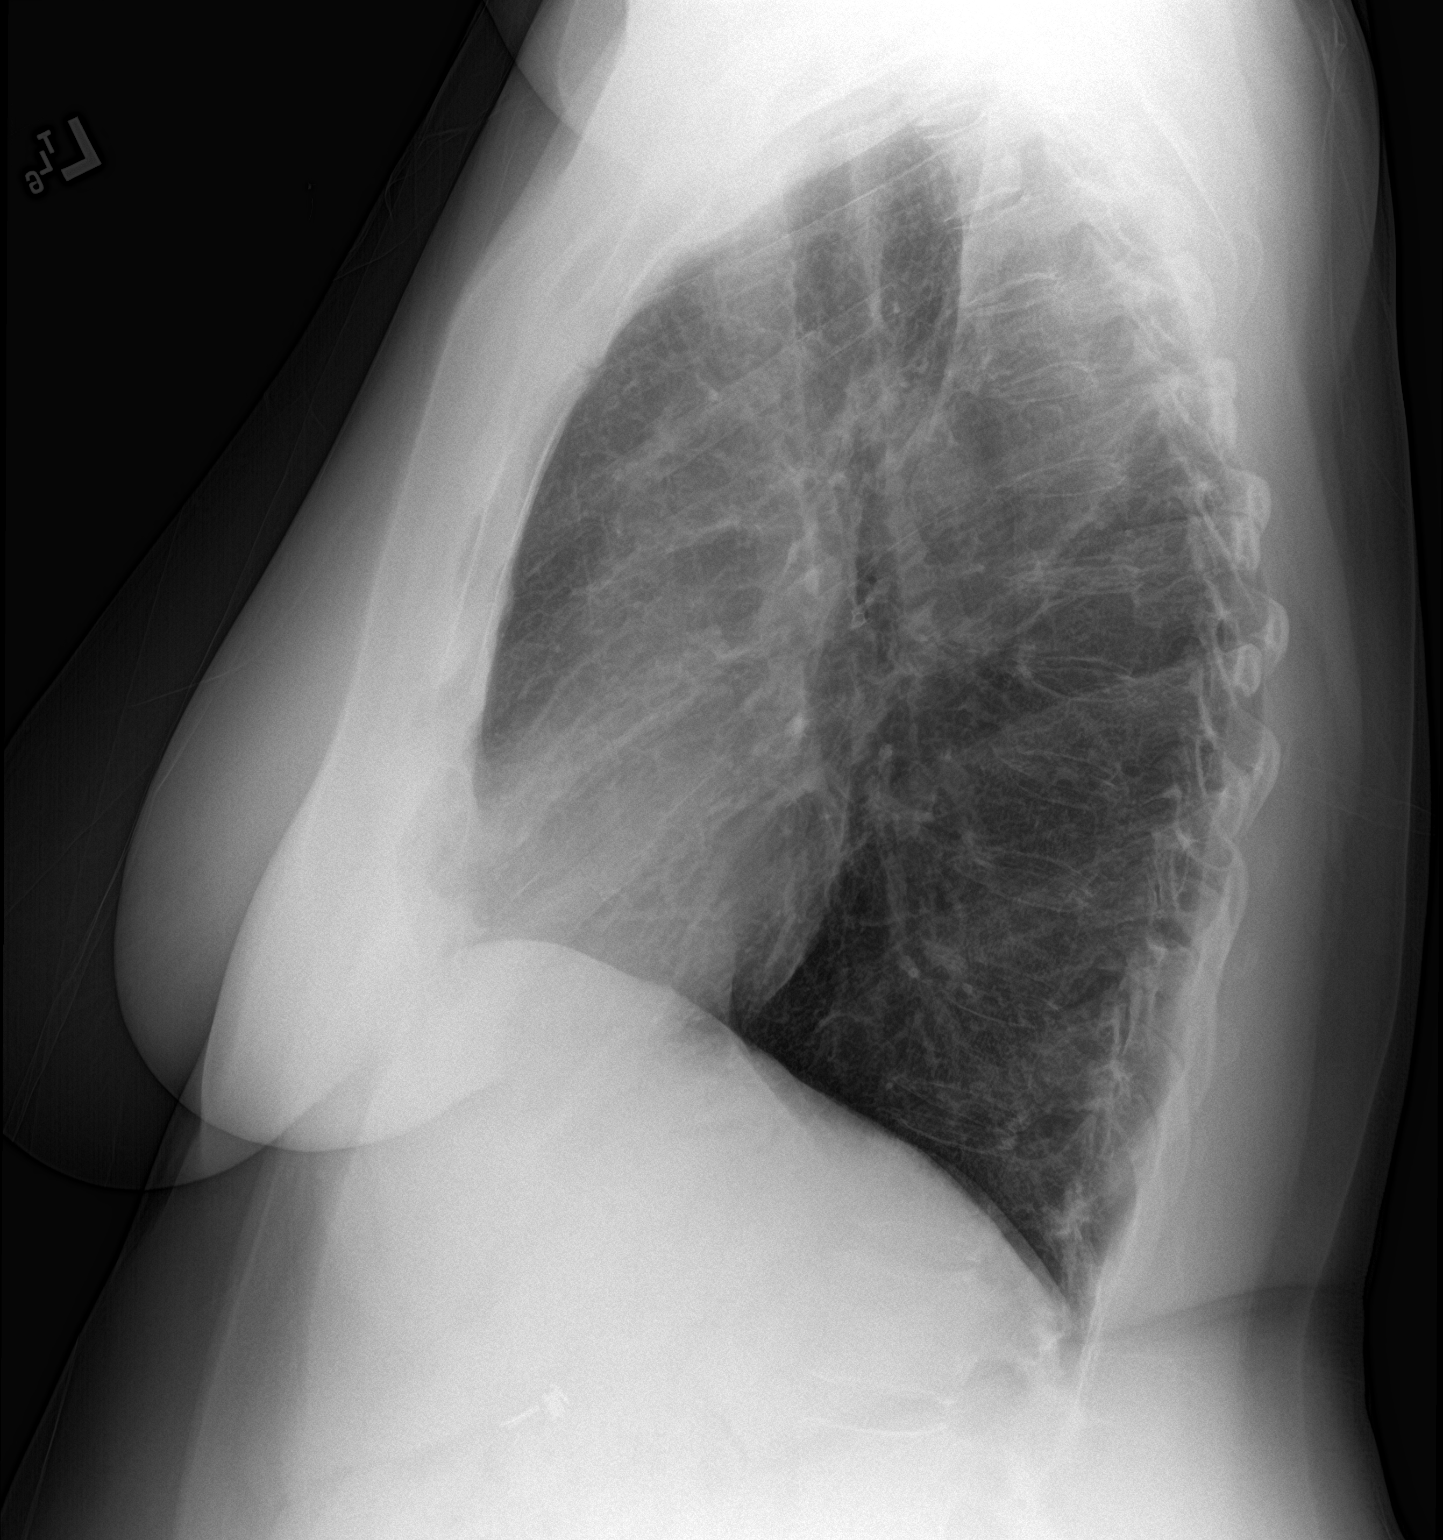

[2 of 2 positions shown; findings below may reference images not displayed]

FINDINGS: The lungs are well-aerated and clear. There is no evidence of focal
opacification, pleural effusion or pneumothorax.

The heart is normal in size; the mediastinal contour is within
normal limits. No acute osseous abnormalities are seen. Clips are
noted within the right upper quadrant, reflecting prior
cholecystectomy.
IMPRESSION: No acute cardiopulmonary process seen.

## 2018-06-24 DIAGNOSIS — J45901 Unspecified asthma with (acute) exacerbation: Secondary | ICD-10-CM | POA: Diagnosis not present

## 2018-07-01 DIAGNOSIS — F331 Major depressive disorder, recurrent, moderate: Secondary | ICD-10-CM | POA: Diagnosis not present

## 2018-07-01 DIAGNOSIS — M542 Cervicalgia: Secondary | ICD-10-CM | POA: Diagnosis not present

## 2018-07-01 DIAGNOSIS — M546 Pain in thoracic spine: Secondary | ICD-10-CM | POA: Diagnosis not present

## 2018-07-01 DIAGNOSIS — G35 Multiple sclerosis: Secondary | ICD-10-CM | POA: Diagnosis not present

## 2018-07-08 DIAGNOSIS — Z79899 Other long term (current) drug therapy: Secondary | ICD-10-CM | POA: Diagnosis not present

## 2018-07-24 DIAGNOSIS — J45901 Unspecified asthma with (acute) exacerbation: Secondary | ICD-10-CM | POA: Diagnosis not present

## 2018-08-24 DIAGNOSIS — J45901 Unspecified asthma with (acute) exacerbation: Secondary | ICD-10-CM | POA: Diagnosis not present

## 2018-09-09 DIAGNOSIS — F331 Major depressive disorder, recurrent, moderate: Secondary | ICD-10-CM | POA: Diagnosis not present

## 2018-09-09 DIAGNOSIS — M546 Pain in thoracic spine: Secondary | ICD-10-CM | POA: Diagnosis not present

## 2018-09-09 DIAGNOSIS — M542 Cervicalgia: Secondary | ICD-10-CM | POA: Diagnosis not present

## 2018-09-09 DIAGNOSIS — G35 Multiple sclerosis: Secondary | ICD-10-CM | POA: Diagnosis not present

## 2018-09-24 DIAGNOSIS — J45901 Unspecified asthma with (acute) exacerbation: Secondary | ICD-10-CM | POA: Diagnosis not present

## 2018-10-07 DIAGNOSIS — G43909 Migraine, unspecified, not intractable, without status migrainosus: Secondary | ICD-10-CM | POA: Diagnosis not present

## 2018-10-07 DIAGNOSIS — G35 Multiple sclerosis: Secondary | ICD-10-CM | POA: Diagnosis not present

## 2018-10-07 DIAGNOSIS — E559 Vitamin D deficiency, unspecified: Secondary | ICD-10-CM | POA: Diagnosis not present

## 2018-10-07 DIAGNOSIS — E538 Deficiency of other specified B group vitamins: Secondary | ICD-10-CM | POA: Diagnosis not present

## 2018-10-23 DIAGNOSIS — J45901 Unspecified asthma with (acute) exacerbation: Secondary | ICD-10-CM | POA: Diagnosis not present

## 2018-11-23 DIAGNOSIS — J45901 Unspecified asthma with (acute) exacerbation: Secondary | ICD-10-CM | POA: Diagnosis not present

## 2018-11-24 ENCOUNTER — Encounter: Payer: Self-pay | Admitting: *Deleted

## 2018-11-24 ENCOUNTER — Other Ambulatory Visit: Payer: Self-pay | Admitting: *Deleted

## 2018-11-24 DIAGNOSIS — Z1231 Encounter for screening mammogram for malignant neoplasm of breast: Secondary | ICD-10-CM

## 2018-12-23 DIAGNOSIS — J45901 Unspecified asthma with (acute) exacerbation: Secondary | ICD-10-CM | POA: Diagnosis not present

## 2019-01-20 DIAGNOSIS — F331 Major depressive disorder, recurrent, moderate: Secondary | ICD-10-CM | POA: Diagnosis not present

## 2019-01-20 DIAGNOSIS — R2681 Unsteadiness on feet: Secondary | ICD-10-CM | POA: Diagnosis not present

## 2019-01-20 DIAGNOSIS — G43009 Migraine without aura, not intractable, without status migrainosus: Secondary | ICD-10-CM | POA: Diagnosis not present

## 2019-01-20 DIAGNOSIS — G35 Multiple sclerosis: Secondary | ICD-10-CM | POA: Diagnosis not present

## 2019-01-23 DIAGNOSIS — J45901 Unspecified asthma with (acute) exacerbation: Secondary | ICD-10-CM | POA: Diagnosis not present

## 2019-02-17 DIAGNOSIS — R296 Repeated falls: Secondary | ICD-10-CM | POA: Diagnosis not present

## 2019-02-17 DIAGNOSIS — R202 Paresthesia of skin: Secondary | ICD-10-CM | POA: Diagnosis not present

## 2019-02-17 DIAGNOSIS — G35 Multiple sclerosis: Secondary | ICD-10-CM | POA: Diagnosis not present

## 2019-02-17 DIAGNOSIS — R2681 Unsteadiness on feet: Secondary | ICD-10-CM | POA: Diagnosis not present

## 2019-03-14 DIAGNOSIS — Z79899 Other long term (current) drug therapy: Secondary | ICD-10-CM | POA: Diagnosis not present

## 2019-03-29 DIAGNOSIS — I10 Essential (primary) hypertension: Secondary | ICD-10-CM | POA: Diagnosis not present

## 2019-03-29 DIAGNOSIS — N390 Urinary tract infection, site not specified: Secondary | ICD-10-CM | POA: Diagnosis not present

## 2019-03-29 DIAGNOSIS — R319 Hematuria, unspecified: Secondary | ICD-10-CM | POA: Diagnosis not present

## 2019-03-29 DIAGNOSIS — R300 Dysuria: Secondary | ICD-10-CM | POA: Diagnosis not present

## 2019-03-29 DIAGNOSIS — Z124 Encounter for screening for malignant neoplasm of cervix: Secondary | ICD-10-CM | POA: Diagnosis not present

## 2019-03-29 DIAGNOSIS — N939 Abnormal uterine and vaginal bleeding, unspecified: Secondary | ICD-10-CM | POA: Diagnosis not present

## 2019-04-28 DIAGNOSIS — R2681 Unsteadiness on feet: Secondary | ICD-10-CM | POA: Diagnosis not present

## 2019-04-28 DIAGNOSIS — M542 Cervicalgia: Secondary | ICD-10-CM | POA: Diagnosis not present

## 2019-04-28 DIAGNOSIS — M546 Pain in thoracic spine: Secondary | ICD-10-CM | POA: Diagnosis not present

## 2019-04-28 DIAGNOSIS — G35 Multiple sclerosis: Secondary | ICD-10-CM | POA: Diagnosis not present

## 2019-07-07 DIAGNOSIS — G35 Multiple sclerosis: Secondary | ICD-10-CM | POA: Diagnosis not present

## 2019-07-07 DIAGNOSIS — E531 Pyridoxine deficiency: Secondary | ICD-10-CM | POA: Diagnosis not present

## 2019-07-07 DIAGNOSIS — E559 Vitamin D deficiency, unspecified: Secondary | ICD-10-CM | POA: Diagnosis not present

## 2019-07-07 DIAGNOSIS — R202 Paresthesia of skin: Secondary | ICD-10-CM | POA: Diagnosis not present

## 2019-07-07 DIAGNOSIS — R2681 Unsteadiness on feet: Secondary | ICD-10-CM | POA: Diagnosis not present

## 2019-07-07 DIAGNOSIS — R296 Repeated falls: Secondary | ICD-10-CM | POA: Diagnosis not present

## 2019-07-07 DIAGNOSIS — Z79899 Other long term (current) drug therapy: Secondary | ICD-10-CM | POA: Diagnosis not present

## 2019-07-07 DIAGNOSIS — E538 Deficiency of other specified B group vitamins: Secondary | ICD-10-CM | POA: Diagnosis not present

## 2019-07-07 DIAGNOSIS — G609 Hereditary and idiopathic neuropathy, unspecified: Secondary | ICD-10-CM | POA: Diagnosis not present

## 2019-10-24 DIAGNOSIS — M25512 Pain in left shoulder: Secondary | ICD-10-CM | POA: Diagnosis not present

## 2019-10-24 DIAGNOSIS — R0781 Pleurodynia: Secondary | ICD-10-CM | POA: Diagnosis not present

## 2019-10-24 DIAGNOSIS — G35 Multiple sclerosis: Secondary | ICD-10-CM | POA: Diagnosis not present

## 2019-10-24 DIAGNOSIS — M25551 Pain in right hip: Secondary | ICD-10-CM | POA: Diagnosis not present

## 2019-10-24 DIAGNOSIS — Z79899 Other long term (current) drug therapy: Secondary | ICD-10-CM | POA: Diagnosis not present

## 2019-10-27 DIAGNOSIS — R0781 Pleurodynia: Secondary | ICD-10-CM | POA: Diagnosis not present

## 2019-10-27 DIAGNOSIS — M25512 Pain in left shoulder: Secondary | ICD-10-CM | POA: Diagnosis not present

## 2019-10-27 DIAGNOSIS — G35 Multiple sclerosis: Secondary | ICD-10-CM | POA: Diagnosis not present

## 2019-10-27 DIAGNOSIS — Z79899 Other long term (current) drug therapy: Secondary | ICD-10-CM | POA: Diagnosis not present

## 2019-10-27 DIAGNOSIS — M25551 Pain in right hip: Secondary | ICD-10-CM | POA: Diagnosis not present

## 2019-11-03 DIAGNOSIS — Z79899 Other long term (current) drug therapy: Secondary | ICD-10-CM | POA: Diagnosis not present

## 2019-11-03 DIAGNOSIS — G43009 Migraine without aura, not intractable, without status migrainosus: Secondary | ICD-10-CM | POA: Diagnosis not present

## 2019-11-03 DIAGNOSIS — M546 Pain in thoracic spine: Secondary | ICD-10-CM | POA: Diagnosis not present

## 2019-11-03 DIAGNOSIS — M542 Cervicalgia: Secondary | ICD-10-CM | POA: Diagnosis not present

## 2019-11-03 DIAGNOSIS — G35 Multiple sclerosis: Secondary | ICD-10-CM | POA: Diagnosis not present

## 2019-11-10 DIAGNOSIS — M25512 Pain in left shoulder: Secondary | ICD-10-CM | POA: Diagnosis not present

## 2019-11-10 DIAGNOSIS — G35 Multiple sclerosis: Secondary | ICD-10-CM | POA: Diagnosis not present

## 2019-11-10 DIAGNOSIS — R0781 Pleurodynia: Secondary | ICD-10-CM | POA: Diagnosis not present

## 2019-11-10 DIAGNOSIS — M25551 Pain in right hip: Secondary | ICD-10-CM | POA: Diagnosis not present

## 2019-11-10 DIAGNOSIS — Z79899 Other long term (current) drug therapy: Secondary | ICD-10-CM | POA: Diagnosis not present

## 2019-11-29 DIAGNOSIS — G35 Multiple sclerosis: Secondary | ICD-10-CM | POA: Diagnosis not present

## 2019-11-29 DIAGNOSIS — M25512 Pain in left shoulder: Secondary | ICD-10-CM | POA: Diagnosis not present

## 2019-11-29 DIAGNOSIS — M25551 Pain in right hip: Secondary | ICD-10-CM | POA: Diagnosis not present

## 2019-11-29 DIAGNOSIS — R0781 Pleurodynia: Secondary | ICD-10-CM | POA: Diagnosis not present

## 2019-11-29 DIAGNOSIS — Z79899 Other long term (current) drug therapy: Secondary | ICD-10-CM | POA: Diagnosis not present

## 2019-12-13 DIAGNOSIS — M25512 Pain in left shoulder: Secondary | ICD-10-CM | POA: Diagnosis not present

## 2019-12-13 DIAGNOSIS — G35 Multiple sclerosis: Secondary | ICD-10-CM | POA: Diagnosis not present

## 2019-12-13 DIAGNOSIS — M25551 Pain in right hip: Secondary | ICD-10-CM | POA: Diagnosis not present

## 2019-12-13 DIAGNOSIS — Z79899 Other long term (current) drug therapy: Secondary | ICD-10-CM | POA: Diagnosis not present

## 2019-12-13 DIAGNOSIS — R0781 Pleurodynia: Secondary | ICD-10-CM | POA: Diagnosis not present
